# Patient Record
Sex: Female | Born: 1990
Health system: Southern US, Community
[De-identification: ages and names within clinical notes are randomized; demographics above are authoritative.]

## PROBLEM LIST (undated history)

## (undated) DIAGNOSIS — Z8489 Family history of other specified conditions: Secondary | ICD-10-CM

## (undated) DIAGNOSIS — R011 Cardiac murmur, unspecified: Secondary | ICD-10-CM

## (undated) DIAGNOSIS — G43909 Migraine, unspecified, not intractable, without status migrainosus: Secondary | ICD-10-CM

## (undated) DIAGNOSIS — F419 Anxiety disorder, unspecified: Secondary | ICD-10-CM

## (undated) DIAGNOSIS — K219 Gastro-esophageal reflux disease without esophagitis: Secondary | ICD-10-CM

## (undated) DIAGNOSIS — N809 Endometriosis, unspecified: Secondary | ICD-10-CM

## (undated) HISTORY — DX: Endometriosis, unspecified: N80.9

## (undated) HISTORY — PX: WISDOM TOOTH EXTRACTION: SHX21

## (undated) HISTORY — PX: OTHER SURGICAL HISTORY: SHX169

## (undated) HISTORY — PX: LAPAROSCOPY: SHX197

---
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
## 2011-04-05 ENCOUNTER — Emergency Department (HOSPITAL_COMMUNITY)
Admission: EM | Admit: 2011-04-05 | Discharge: 2011-04-06 | Disposition: A | Discharge status: Home / Self Care | Payer: PPO | Attending: Emergency Medicine | Admitting: Emergency Medicine

## 2011-04-05 DIAGNOSIS — E860 Dehydration: Secondary | ICD-10-CM

## 2011-04-05 DIAGNOSIS — B9789 Other viral agents as the cause of diseases classified elsewhere: Secondary | ICD-10-CM | POA: Insufficient documentation

## 2011-04-05 DIAGNOSIS — B349 Viral infection, unspecified: Secondary | ICD-10-CM

## 2011-04-05 DIAGNOSIS — R112 Nausea with vomiting, unspecified: Secondary | ICD-10-CM | POA: Insufficient documentation

## 2011-04-05 DIAGNOSIS — R51 Headache: Secondary | ICD-10-CM | POA: Insufficient documentation

## 2011-04-05 DIAGNOSIS — R197 Diarrhea, unspecified: Secondary | ICD-10-CM | POA: Insufficient documentation

## 2011-04-05 HISTORY — DX: Migraine, unspecified, not intractable, without status migrainosus: G43.909

## 2011-04-05 HISTORY — DX: Cardiac murmur, unspecified: R01.1

## 2011-04-06 ENCOUNTER — Encounter: Payer: Self-pay | Admitting: Emergency Medicine

## 2011-04-06 LAB — COMPREHENSIVE METABOLIC PANEL
AST: 21 U/L (ref 0–37)
Albumin: 4.5 g/dL (ref 3.5–5.2)
BUN: 17 mg/dL (ref 6–23)
Calcium: 9.8 mg/dL (ref 8.4–10.5)
Chloride: 102 mEq/L (ref 96–112)
Creatinine, Ser: 0.66 mg/dL (ref 0.50–1.10)
Total Protein: 7.5 g/dL (ref 6.0–8.3)

## 2011-04-06 LAB — URINALYSIS, ROUTINE W REFLEX MICROSCOPIC
Glucose, UA: NEGATIVE mg/dL
Ketones, ur: 40 mg/dL — AB
Leukocytes, UA: NEGATIVE
Nitrite: NEGATIVE
Specific Gravity, Urine: 1.033 — ABNORMAL HIGH (ref 1.005–1.030)
pH: 8 (ref 5.0–8.0)

## 2011-04-06 LAB — DIFFERENTIAL
Basophils Absolute: 0 10*3/uL (ref 0.0–0.1)
Basophils Relative: 0 % (ref 0–1)
Eosinophils Absolute: 0 10*3/uL (ref 0.0–0.7)
Monocytes Absolute: 0.7 10*3/uL (ref 0.1–1.0)
Monocytes Relative: 5 % (ref 3–12)
Neutro Abs: 13.3 10*3/uL — ABNORMAL HIGH (ref 1.7–7.7)

## 2011-04-06 LAB — CBC
HCT: 37.1 % (ref 36.0–46.0)
MCH: 30.5 pg (ref 26.0–34.0)
MCHC: 35.3 g/dL (ref 30.0–36.0)
RDW: 12.7 % (ref 11.5–15.5)

## 2011-04-06 LAB — PREGNANCY, URINE: Preg Test, Ur: NEGATIVE

## 2011-04-06 MED ORDER — ONDANSETRON HCL 4 MG PO TABS: 4.0000 mg | ORAL_TABLET | Freq: Four times a day (QID) | ORAL | Status: AC

## 2011-04-06 MED ORDER — ONDANSETRON HCL 4 MG/2ML IJ SOLN
4.0000 mg | Freq: Once | INTRAMUSCULAR | Status: AC
  Administered 2011-04-06: 4 mg via INTRAVENOUS
  Filled 2011-04-06: qty 2

## 2011-04-06 MED ORDER — SODIUM CHLORIDE 0.9 % IV BOLUS (SEPSIS)
1000.0000 mL | Freq: Once | INTRAVENOUS | Status: AC
  Administered 2011-04-06: 1000 mL via INTRAVENOUS

## 2011-04-06 NOTE — ED Notes (Signed)
Patient denies pain and is resting comfortably. Pt states she feels a lot better.

## 2011-04-06 NOTE — ED Notes (Signed)
Pt to ER with c/o n/v/diarrhea x 3 days. 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 Pt also reports h/a x 2 days. Pt states symptoms got worse this am

## 2011-04-06 NOTE — ED Notes (Signed)
Pt given discharge instructions and verbalizes understanding  

## 2011-04-06 NOTE — ED Notes (Signed)
Pt states she has not felt well all day feeling tired and wore out  Pt states when she got off work at 1700 she was feeling nauseated  Vomiting started about 1800  Pt states she had a migraine earlier which is still there but has improved

## 2011-04-06 NOTE — ED Provider Notes (Cosign Needed Addendum)
History     CSN: [id number] Arrival date & time: 04/05/2011 11:48 PM   First MD Initiated Contact with Patient 04/06/11 0131      Chief Complaint  Patient presents with  . Nausea  . Migraine  . Emesis    (Consider location/radiation/quality/duration/timing/severity/associated sxs/prior treatment) HPI Patient is a Manufacturing systems engineer. She, states she's been having bad. "Migraines" throughout the day. She has also been having nausea, vomiting, diarrhea, and fatigue. She's had no abdominal or back pain. Denies any dysuria. She has had no numbness, weakness or tingling. She did take ibuprofen, which helped out her headache a lot. She has no chest pain or palpitations. She's had no recent travel   Past Medical History  Diagnosis Date  . Migraines   . Heart murmur     Past Surgical History  Procedure Date  . Wisdom tooth extraction     Family History  Problem Relation Age of Onset  . Heart disease Other   . Diabetes Other     History  Substance Use Topics  . Smoking status: Current Some Day Smoker  . Smokeless tobacco: Not on file  . Alcohol Use: Yes     occassional    OB History    Grav Para Term Preterm Abortions TAB SAB Ect Mult Living                  Review of Systems  All other systems reviewed and are negative.    Allergies  Penicillins  Home Medications   Current Outpatient Rx  Name Route Sig Dispense Refill  . IBUPROFEN 200 MG PO TABS Oral Take 400 mg by mouth every 6 (six) hours as needed. Migraines       BP 111/73  Pulse 98  Temp(Src) 99.4 F (37.4 C) (Oral)  Resp 18  SpO2 99%  LMP 03/20/2011  Physical Exam  Constitutional: She is oriented to person, place, and time. She appears well-developed and well-nourished. No distress.  HENT:  Head: Normocephalic and atraumatic.  Eyes: Conjunctivae and EOM are normal. Pupils are equal, round, and reactive to light.  Neck: Neck supple.  Cardiovascular: Normal rate and regular rhythm.  Exam  reveals no gallop and no friction rub.   No murmur heard. Pulmonary/Chest: Breath sounds normal. She has no wheezes. She has no rales. She exhibits no tenderness.  Abdominal: Soft. Bowel sounds are normal. She exhibits no distension. There is no tenderness. There is no rebound and no guarding.  Musculoskeletal: Normal range of motion.  Neurological: She is alert and oriented to person, place, and time. No cranial nerve deficit. Coordination normal.  Skin: Skin is warm and dry. No rash noted.  Psychiatric: She has a normal mood and affect.    ED Course  Procedures (including critical care time)  Labs Reviewed  CBC - Abnormal; Notable for the following:    WBC 14.3 (*)    All other components within normal limits  DIFFERENTIAL - Abnormal; Notable for the following:    Neutrophils Relative 93 (*)    Neutro Abs 13.3 (*)    Lymphocytes Relative 2 (*)    Lymphs Abs 0.3 (*)    All other components within normal limits  COMPREHENSIVE METABOLIC PANEL - Abnormal; Notable for the following:    Glucose, Bld 102 (*)    Total Bilirubin 1.5 (*)    All other components within normal limits  URINALYSIS, ROUTINE W REFLEX MICROSCOPIC - Abnormal; Notable for the following:    Specific Gravity, Urine  1.033 (*)    Ketones, ur 40 (*)    All other components within normal limits  PREGNANCY, URINE   No results found.   No diagnosis found.    MDM  Pt is seen and examined;  Initial history and physical completed.  Will follow.          Linda Biehn A. Patrica Duel, MD 04/06/11 [phone]  Results for orders placed during the hospital encounter of 04/05/11  CBC      Component Value Range   WBC 14.3 (*) 4.0 - 10.5 (K/uL)   RBC 4.29  3.87 - 5.11 (MIL/uL)   Hemoglobin 13.1  12.0 - 15.0 (g/dL)   HCT 54.0  98.1 - 19.1 (%)   MCV 86.5  78.0 - 100.0 (fL)   MCH 30.5  26.0 - 34.0 (pg)   MCHC 35.3  30.0 - 36.0 (g/dL)   RDW 47.8  29.5 - 62.1 (%)   Platelets 236  150 - 400 (K/uL)  DIFFERENTIAL      Component  Value Range   Neutrophils Relative 93 (*) 43 - 77 (%)   Neutro Abs 13.3 (*) 1.7 - 7.7 (K/uL)   Lymphocytes Relative 2 (*) 12 - 46 (%)   Lymphs Abs 0.3 (*) 0.7 - 4.0 (K/uL)   Monocytes Relative 5  3 - 12 (%)   Monocytes Absolute 0.7  0.1 - 1.0 (K/uL)   Eosinophils Relative 0  0 - 5 (%)   Eosinophils Absolute 0.0  0.0 - 0.7 (K/uL)   Basophils Relative 0  0 - 1 (%)   Basophils Absolute 0.0  0.0 - 0.1 (K/uL)  COMPREHENSIVE METABOLIC PANEL      Component Value Range   Sodium 137  135 - 145 (mEq/L)   Potassium 3.9  3.5 - 5.1 (mEq/L)   Chloride 102  96 - 112 (mEq/L)   CO2 23  19 - 32 (mEq/L)   Glucose, Bld 102 (*) 70 - 99 (mg/dL)   BUN 17  6 - 23 (mg/dL)   Creatinine, Ser 3.08  0.50 - 1.10 (mg/dL)   Calcium 9.8  8.4 - 65.7 (mg/dL)   Total Protein 7.5  6.0 - 8.3 (g/dL)   Albumin 4.5  3.5 - 5.2 (g/dL)   AST 21  0 - 37 (U/L)   ALT 19  0 - 35 (U/L)   Alkaline Phosphatase 71  39 - 117 (U/L)   Total Bilirubin 1.5 (*) 0.3 - 1.2 (mg/dL)   GFR calc non Af Amer >90  >90 (mL/min)   GFR calc Af Amer >90  >90 (mL/min)  PREGNANCY, URINE      Component Value Range   Preg Test, Ur NEGATIVE    URINALYSIS, ROUTINE W REFLEX MICROSCOPIC      Component Value Range   Color, Urine YELLOW  YELLOW    Appearance CLEAR  CLEAR    Specific Gravity, Urine 1.033 (*) 1.005 - 1.030    pH 8.0  5.0 - 8.0    Glucose, UA NEGATIVE  NEGATIVE (mg/dL)   Hgb urine dipstick NEGATIVE  NEGATIVE    Bilirubin Urine NEGATIVE  NEGATIVE    Ketones, ur 40 (*) NEGATIVE (mg/dL)   Protein, ur NEGATIVE  NEGATIVE (mg/dL)   Urobilinogen, UA 0.2  0.0 - 1.0 (mg/dL)   Nitrite NEGATIVE  NEGATIVE    Leukocytes, UA NEGATIVE  NEGATIVE    No results found.  Patient is feeling much better after IV fluids. Ketones noted in urine. Electrolytes normal. Patient is afebrile. Consider  viral syndrome. As the etiology for her symptoms. She would like to go much receiving IV fluids. She was told to take a bland diet, encourage liquids. Tylenol or  Motrin for pain or fever. Return to the ER for any concerns.  Mark Hassey A. Patrica Duel, MD 04/06/11 [phone]

## 2011-05-24 ENCOUNTER — Ambulatory Visit (INDEPENDENT_AMBULATORY_CARE_PROVIDER_SITE_OTHER): Payer: PPO

## 2011-05-24 DIAGNOSIS — Z0000 Encounter for general adult medical examination without abnormal findings: Secondary | ICD-10-CM

## 2011-05-26 ENCOUNTER — Ambulatory Visit (INDEPENDENT_AMBULATORY_CARE_PROVIDER_SITE_OTHER): Payer: PPO

## 2011-05-26 DIAGNOSIS — Z0389 Encounter for observation for other suspected diseases and conditions ruled out: Secondary | ICD-10-CM

## 2011-09-03 ENCOUNTER — Ambulatory Visit (INDEPENDENT_AMBULATORY_CARE_PROVIDER_SITE_OTHER): Payer: PPO | Admitting: Internal Medicine

## 2011-09-03 DIAGNOSIS — R1084 Generalized abdominal pain: Secondary | ICD-10-CM

## 2011-09-03 DIAGNOSIS — R110 Nausea: Secondary | ICD-10-CM

## 2011-09-03 DIAGNOSIS — R300 Dysuria: Secondary | ICD-10-CM

## 2011-09-03 LAB — POCT CBC
HCT, POC: 40.3 % (ref 37.7–47.9)
Hemoglobin: 13.6 g/dL (ref 12.2–16.2)
Lymph, poc: 2.4 (ref 0.6–3.4)
MCH, POC: 30.6 pg (ref 27–31.2)
MCHC: 33.7 g/dL (ref 31.8–35.4)
MPV: 9.7 fL (ref 0–99.8)
POC MID %: 11.6 %M (ref 0–12)
RBC: 4.45 M/uL (ref 4.04–5.48)
WBC: 12.6 10*3/uL — AB (ref 4.6–10.2)

## 2011-09-03 LAB — POCT URINE PREGNANCY: Preg Test, Ur: NEGATIVE

## 2011-09-03 MED ORDER — OMEPRAZOLE 20 MG PO CPDR: 20.0000 mg | DELAYED_RELEASE_CAPSULE | Freq: Every day | ORAL | Status: DC

## 2011-09-03 NOTE — Progress Notes (Signed)
  Subjective:    Patient ID: Theresa Christensen, female    DOB: [birth year], 21 y.o.   MRN: [id number]  HPI Abdominal pain Right upper abdomen pain and mid epigstric pain Onset 2 weeks ago  Moderate in severity Occurs after meals esp lunch and Dinner No fried or fatty foods No milk intolerence Has been on a diet Has lost 10 pounds in past few weeks. No fever No diarhhea No travels No sore thoat    Review of Systems  Constitutional: Positive for activity change.  Eyes: Negative.   Respiratory: Negative.   Cardiovascular: Negative.   Gastrointestinal: Positive for abdominal pain. Negative for nausea, vomiting, diarrhea, constipation and abdominal distention.  Genitourinary: Negative.   Musculoskeletal: Negative.   Neurological: Negative.   Hematological: Negative.   Psychiatric/Behavioral: Negative.   All other systems reviewed and are negative.       Objective:   Physical Exam  Nursing note and vitals reviewed. Constitutional: She is oriented to person, place, and time. She appears well-developed and well-nourished.  HENT:  Head: Normocephalic and atraumatic.  Right Ear: External ear normal.  Left Ear: External ear normal.  Nose: Nose normal.  Mouth/Throat: Oropharynx is clear and moist.  Eyes: Conjunctivae and EOM are normal. Pupils are equal, round, and reactive to light.  Neck: Normal range of motion.  Cardiovascular: Normal rate, regular rhythm, normal heart sounds and intact distal pulses.   Pulmonary/Chest: Effort normal.  Abdominal: Soft. She exhibits no distension and no mass. There is tenderness. There is no rebound and no guarding.       Tenderness in  the ruq and mid epigastric are.  Musculoskeletal: Normal range of motion.  Neurological: She is alert and oriented to person, place, and time. She has normal reflexes.  Skin: Skin is warm and dry.  Psychiatric: She has a normal mood and affect. Her behavior is normal. Judgment and thought content normal.     Results for orders placed in visit on 09/03/11  POCT URINE PREGNANCY      Component Value Range   Preg Test, Ur Negative    GLUCOSE, POCT (MANUAL RESULT ENTRY)      Component Value Range   POC Glucose 85    POCT CBC      Component Value Range   WBC 12.6 (*) 4.6 - 10.2 (K/uL)   Lymph, poc 2.4  0.6 - 3.4    POC LYMPH PERCENT 19.2  10 - 50 (%L)   MID (cbc) 1.5 (*) 0 - 0.9    POC MID % 11.6  0 - 12 (%M)   POC Granulocyte 8.7 (*) 2 - 6.9    Granulocyte percent 69.2  37 - 80 (%G)   RBC 4.45  4.04 - 5.48 (M/uL)   Hemoglobin 13.6  12.2 - 16.2 (g/dL)   HCT, POC 78.2  95.6 - 47.9 (%)   MCV 90.5  80 - 97 (fL)   MCH, POC 30.6  27 - 31.2 (pg)   MCHC 33.7  31.8 - 35.4 (g/dL)   RDW, POC 21.3     Platelet Count, POC 298  142 - 424 (K/uL)   MPV 9.7  0 - 99.8 (fL)   Elevated wbc no fever will obtain US to eval abd including gb and appendix. Not pregnant glucose normal.     Assessment & Plan:  abd pain after losing weight on portion controlled diet denies diet pills or supplements. Will eval gallbladder and appendix and will refer to surgery if postive

## 2011-09-03 NOTE — Patient Instructions (Signed)
prilosec as directed. To have ultrasound to evaluate the gallbladder and the appendix . Low fat non fried diet. fup as directed.

## 2011-09-05 ENCOUNTER — Ambulatory Visit
Admission: RE | Admit: 2011-09-05 | Discharge: 2011-09-05 | Disposition: A | Discharge status: Home / Self Care | Payer: PPO | Source: Ambulatory Visit | Attending: Internal Medicine | Admitting: Internal Medicine

## 2011-09-05 ENCOUNTER — Telehealth: Payer: Self-pay | Admitting: Radiology

## 2011-09-05 NOTE — Telephone Encounter (Signed)
Spoke to mother. The results of the abd. ultrasound were given to her. They were negative and we asked that the patient come back in if the symptoms persisted. The mother said they were concerned about ulcer disease. She feels Dr. Mindi Junker has a plan in place. She does not want to return to the office but wants Dr. Mindi Junker to call her.

## 2015-02-16 ENCOUNTER — Ambulatory Visit (INDEPENDENT_AMBULATORY_CARE_PROVIDER_SITE_OTHER): Payer: 59 | Admitting: Family Medicine

## 2015-02-16 VITALS — BP 110/70 | HR 77 | Temp 98.9°F | Resp 18 | Ht 66.0 in | Wt 168.0 lb

## 2015-02-16 DIAGNOSIS — F411 Generalized anxiety disorder: Secondary | ICD-10-CM

## 2015-02-16 MED ORDER — FLUOXETINE HCL 20 MG PO TABS: 20.0000 mg | ORAL_TABLET | Freq: Every day | ORAL | Status: DC

## 2015-02-16 NOTE — Progress Notes (Signed)
[facility] [address], [place] [address] [id number]  Date:  02/16/2015   Name:  Theresa Christensen   DOB:  [birth year]   MRN:  [id number]  PCP:  No primary care provider on file.    Chief Complaint: Anxiety   History of Present Illness:  Theresa Christensen is a 24 y.o. very pleasant female patient who presents with the following:  Generally healthy young lady who is here today with concern of anxiety.   Over the last 18 months she notes that she tends to be "overthinking things," can be irritable, tends to pick at her face or bite her lip. She is not sure if her sx are worsening lately or if she is more aware of her sx now.   She did have some sx of anxiety as a young child- she might get frightened easily, would have a hard time sleeping.    She notes that sometimes she will "get a feeling like something is wrong and then I will have to call everyone I know to make sure they are ok," sometimes a "bad feeling" will cause her to avoid going out of the house.  She tends to be irritable and over- protective of her friends and family.    She does not have much in the way of OCD symptoms. Spends less than an hour daily on "checking" behavior She does not notice any overly sad mood.  Worry is her main problem.   She can have a very hard time being in crowds.    She works at [employer] as a Holiday representative.  She lives with her GF- they have a good relationship, but she does feel that her anxiety can get in the way of their relationship happiness For fun she enjoys hanging out with friends. Dinner, movies, etc.    She does not smoker, she drinks alcohol rarely.  She does not use any drugs.   She has never been treated for anxiety in the past.  She generally does not see doctors.    Her sleep is sporadic- can be ok, but sometimes she does not sleep well due to worried  She feels like anxiety bothers her about 70% of the time if not more.    She did recently have  labs drawn through her job and all looked good.    There are no active problems to display for this patient.   Past Medical History  Diagnosis Date  . Migraines   . Heart murmur     Past Surgical History  Procedure Laterality Date  . Wisdom tooth extraction      Social History  Substance Use Topics  . Smoking status: Never Smoker   . Smokeless tobacco: None  . Alcohol Use: No     Comment: occassional    Family History  Problem Relation Age of Onset  . Heart disease Other   . Diabetes Other     Allergies  Allergen Reactions  . Penicillins Itching    Rash with minor swelling    Medication list has been reviewed and updated.  Current Outpatient Prescriptions on File Prior to Visit  Medication Sig Dispense Refill  . ibuprofen (ADVIL,MOTRIN) 200 MG tablet Take 400 mg by mouth every 6 (six) hours as needed. Migraines     . omeprazole (PRILOSEC) 20 MG capsule Take 1 capsule (20 mg total) by mouth daily. 30 capsule 3   No current facility-administered medications on file prior to visit.  Review of Systems:  As per HPI- otherwise negative.   Physical Examination: Filed Vitals:   02/16/15 1437  BP: 110/70  Pulse: 77  Temp: 98.9 F (37.2 C)  Resp: 18   Filed Vitals:   02/16/15 1437  Height:  (1.676 m)  Weight: 168 lb (76.204 kg)   Body mass index is 27.13 kg/(m^2). Ideal Body Weight: Weight in (lb) to have BMI = 25: 154.6  GEN: WDWN, NAD, Non-toxic, A & O x 3, overweight, well appearing  HEENT: Atraumatic, Normocephalic. Neck supple. No masses, No LAD. Ears and Nose: No external deformity. CV: RRR, No M/G/R. No JVD. No thrill. No extra heart sounds. PULM: CTA B, no wheezes, crackles, rhonchi. No retractions. No resp. distress. No accessory muscle use. ABD: S, NT, ND EXTR: No c/c/e NEURO Normal gait.  PSYCH: Normally interactive. Conversant. Not depressed or anxious appearing.  Calm demeanor.   Assessment and Plan: GAD (generalized anxiety  disorder) - Plan: FLUoxetine (PROZAC) 20 MG tablet  Start on prozac 20 mg for GAD- will increase to 40 mg after a couple of weeks She will look into counseling but it not sure if she can afford it.   Follow-up in a few months  Signed Abbe Amsterdam, MD

## 2015-02-16 NOTE — Patient Instructions (Signed)
Try the prozac for your anxiety- take 1 daily, increase to 2 pills after 2 weeks.  I hope that after a few weeks you will notice a decrease in your anxiety!   Try to take good care of your body with exercise and healthy diet, plenty of water.   Using melatonin before bed can help with sleep.  If you are able to afford it, counseling can also be helpful- the [employer] Today website has a fairly comprehensive list of providers in town.    Please email or call me in a month to let me know how your are doing- sooner if any concerns Otherwise let's check back in 2-3 months

## 2015-04-25 ENCOUNTER — Telehealth: Payer: Self-pay

## 2015-04-25 NOTE — Telephone Encounter (Addendum)
[facility] - Pt was to touch base with you after 1-2 months.  Her insurance is requesting medicine to be mailed.  Says any medicine that is prescribed on a regular basis will be done this way.  I think she is also going to need pre-authorization for it.  She is not sure about how the process works.  Please call her at [phone]

## 2015-04-26 NOTE — Telephone Encounter (Signed)
Called patient back. She stated that the Prozac 20mg  has helped her a lot within the last few months. She has noticed a positive difference as well as people around her. She wants to continue to take the medication.   She also stated that her insurance company will no longer allow her to get her medications filled at the [facility] [place], [place]. They told her that she will need to use a mail order pharmacy. When asked what pharmacy did she wanted to use, she stated that the insurance company would call us to let us know. As of today, I do not see a new pharmacy listed for her nor a call from the insurance company.  I also explained to her how a pre-auth works and reassured her that we could help her in anyway possible.

## 2015-04-27 ENCOUNTER — Other Ambulatory Visit: Payer: Self-pay

## 2015-04-27 DIAGNOSIS — F411 Generalized anxiety disorder: Secondary | ICD-10-CM

## 2015-04-27 MED ORDER — FLUOXETINE HCL 20 MG PO TABS: 20.0000 mg | ORAL_TABLET | Freq: Every day | ORAL | Status: DC

## 2015-05-30 ENCOUNTER — Other Ambulatory Visit: Payer: Self-pay

## 2015-05-30 DIAGNOSIS — F411 Generalized anxiety disorder: Secondary | ICD-10-CM

## 2015-05-30 MED ORDER — FLUOXETINE HCL 20 MG PO TABS: 20.0000 mg | ORAL_TABLET | Freq: Every day | ORAL | Status: DC

## 2017-02-15 ENCOUNTER — Encounter: Payer: Self-pay | Admitting: Family Medicine

## 2017-02-15 ENCOUNTER — Ambulatory Visit (INDEPENDENT_AMBULATORY_CARE_PROVIDER_SITE_OTHER): Payer: 59 | Admitting: Family Medicine

## 2017-02-15 VITALS — BP 102/68 | HR 70 | Temp 98.2°F | Resp 18 | Ht 65.0 in | Wt 183.0 lb

## 2017-02-15 DIAGNOSIS — E663 Overweight: Secondary | ICD-10-CM | POA: Diagnosis not present

## 2017-02-15 DIAGNOSIS — Z6830 Body mass index (BMI) 30.0-30.9, adult: Secondary | ICD-10-CM

## 2017-02-15 NOTE — Progress Notes (Signed)
9/29/20183:40 PM  Theresa Christensen 11/06/1990, 26 y.o. female [id number]  Chief Complaint  Patient presents with  . Weight Loss    Has an appeal form from insurance    HPI:   Patient is a 26 y.o. female who presents today requesting that a form to appeal her insurance be completed. Her employer charges $50 more if certain criteria are not meet. In patient's case it is her BMI > 30. She however has been working on her weight loss. She used to weigh 206 lbs. She had been exercising daily with cardio and weights. She had to stop 7 weeks ago due to retinal surgery for detachment. Optho just released her back to regular exercise. She has started with cardio. She continues to try to eat healthier, but continues to struggles with this specially with working through cravings and when eating out with her parents.   Depression screen [facility] 2/9 02/15/2017 02/16/2015  Decreased Interest 0 0  Down, Depressed, Hopeless 0 0  PHQ - 2 Score 0 0    Allergies  Allergen Reactions  . Penicillins Itching    Rash with minor swelling    Current Outpatient Prescriptions on File Prior to Visit  Medication Sig Dispense Refill  . ibuprofen (ADVIL,MOTRIN) 200 MG tablet Take 400 mg by mouth every 6 (six) hours as needed. Migraines      No current facility-administered medications on file prior to visit.     Past Medical History:  Diagnosis Date  . Heart murmur   . Migraines     Past Surgical History:  Procedure Laterality Date  . WISDOM TOOTH EXTRACTION      Social History  Substance Use Topics  . Smoking status: Never Smoker  . Smokeless tobacco: Never Used  . Alcohol use No     Comment: occassional    Family History  Problem Relation Age of Onset  . Heart disease Other   . Diabetes Other     Review of Systems  Constitutional: Negative for chills and fever.  Respiratory: Negative for cough and shortness of breath.   Cardiovascular: Negative for chest pain, palpitations and leg  swelling.  Gastrointestinal: Negative for abdominal pain, nausea and vomiting.     OBJECTIVE:  Blood pressure 102/68, pulse 70, temperature 98.2 F (36.8 C), temperature source Oral, resp. rate 18, height  (1.651 m), weight 183 lb (83 kg), SpO2 99 %.  Physical Exam  Constitutional: She is oriented to person, place, and time and well-developed, well-nourished, and in no distress.  HENT:  Head: Normocephalic and atraumatic.  Mouth/Throat: Oropharynx is clear and moist. No oropharyngeal exudate.  Eyes: Pupils are equal, round, and reactive to light. EOM are normal. No scleral icterus.  Neck: Neck supple.  Cardiovascular: Normal rate, regular rhythm and normal heart sounds.  Exam reveals no gallop and no friction rub.   No murmur heard. Pulmonary/Chest: Effort normal and breath sounds normal. She has no wheezes. She has no rales.  Musculoskeletal: She exhibits no edema.  Neurological: She is alert and oriented to person, place, and time. Gait normal.  Skin: Skin is warm and dry.    ASSESSMENT and PLAN  1. Body mass index (BMI) of 30.0 to 30.9 in adult Greater than 50% of this 25 minutes visit was spent providing counseling on weight loss, exercise and diet. Brained stormed strategies, used MI techniques, congratulated on current gains and encouraged continue lifestyle changes. Paperwork for [employer] completed.    Return in about 1 year (around  02/15/2018).    Myles Lipps, MD Primary Care at [facility] [address] [place], [place] [address] Ph.  [phone] Fax [phone]

## 2017-02-15 NOTE — Patient Instructions (Signed)
     IF you received an x-ray today, you will receive an invoice from [facility]. Please contact [facility] at [phone] with questions or concerns regarding your invoice.   IF you received labwork today, you will receive an invoice from [facility]. Please contact [facility] at [phone] with questions or concerns regarding your invoice.   Our billing staff will not be able to assist you with questions regarding bills from these companies.  You will be contacted with the lab results as soon as they are available. The fastest way to get your results is to activate your My Chart account. Instructions are located on the last page of this paperwork. If you have not heard from us regarding the results in 2 weeks, please contact this office.     

## 2017-10-16 DIAGNOSIS — H43393 Other vitreous opacities, bilateral: Secondary | ICD-10-CM | POA: Diagnosis not present

## 2017-10-16 DIAGNOSIS — H35413 Lattice degeneration of retina, bilateral: Secondary | ICD-10-CM | POA: Diagnosis not present

## 2017-11-15 ENCOUNTER — Encounter: Payer: Self-pay | Admitting: Family Medicine

## 2017-11-15 ENCOUNTER — Ambulatory Visit: Payer: BLUE CROSS/BLUE SHIELD | Admitting: Family Medicine

## 2017-11-15 VITALS — BP 124/81 | HR 63 | Temp 98.8°F | Resp 16 | Ht 65.0 in | Wt 174.0 lb

## 2017-11-15 DIAGNOSIS — G4700 Insomnia, unspecified: Secondary | ICD-10-CM

## 2017-11-15 DIAGNOSIS — F411 Generalized anxiety disorder: Secondary | ICD-10-CM

## 2017-11-15 MED ORDER — HYDROXYZINE HCL 10 MG PO TABS: 10.0000 mg | ORAL_TABLET | Freq: Three times a day (TID) | ORAL | 0 refills | Status: DC | PRN

## 2017-11-15 MED ORDER — CITALOPRAM HYDROBROMIDE 20 MG PO TABS: 20.0000 mg | ORAL_TABLET | Freq: Every day | ORAL | 1 refills | Status: DC

## 2017-11-15 NOTE — Patient Instructions (Addendum)
See info on anxiety below. I would recommend starting back on medication as well as meeting with a therapist as we discussed. Here are a few numbers, but let me know if other names needed. You can also see if there is an EAP program at work that may have more cost effective options for counseling. For sleep - can try prescription hydroxyzine if needed (that can be used during anxiety flares as well if needed). See other info below.   Start celexa once per day and recheck in next few weeks. Restarting exercise most day per week.   Karmen Bongo: [phone] [place]: [phone]  Insomnia Insomnia is a sleep disorder that makes it difficult to fall asleep or to stay asleep. Insomnia can cause tiredness (fatigue), low energy, difficulty concentrating, mood swings, and poor performance at work or school. There are three different ways to classify insomnia:  Difficulty falling asleep.  Difficulty staying asleep.  Waking up too early in the morning.  Any type of insomnia can be long-term (chronic) or short-term (acute). Both are common. Short-term insomnia usually lasts for three months or less. Chronic insomnia occurs at least three times a week for longer than three months. What are the causes? Insomnia may be caused by another condition, situation, or substance, such as:  Anxiety.  Certain medicines.  Gastroesophageal reflux disease (GERD) or other gastrointestinal conditions.  Asthma or other breathing conditions.  Restless legs syndrome, sleep apnea, or other sleep disorders.  Chronic pain.  Menopause. This may include hot flashes.  Stroke.  Abuse of alcohol, tobacco, or illegal drugs.  Depression.  Caffeine.  Neurological disorders, such as Alzheimer disease.  An overactive thyroid (hyperthyroidism).  The cause of insomnia may not be known. What increases the risk? Risk factors for insomnia include:  Gender. Women are more commonly affected than men.  Age.  Insomnia is more common as you get older.  Stress. This may involve your professional or personal life.  Income. Insomnia is more common in people with lower income.  Lack of exercise.  Irregular work schedule or night shifts.  Traveling between different time zones.  What are the signs or symptoms? If you have insomnia, trouble falling asleep or trouble staying asleep is the main symptom. This may lead to other symptoms, such as:  Feeling fatigued.  Feeling nervous about going to sleep.  Not feeling rested in the morning.  Having trouble concentrating.  Feeling irritable, anxious, or depressed.  How is this treated? Treatment for insomnia depends on the cause. If your insomnia is caused by an underlying condition, treatment will focus on addressing the condition. Treatment may also include:  Medicines to help you sleep.  Counseling or therapy.  Lifestyle adjustments.  Follow these instructions at home:  Take medicines only as directed by your health care provider.  Keep regular sleeping and waking hours. Avoid naps.  Keep a sleep diary to help you and your health care provider figure out what could be causing your insomnia. Include: ? When you sleep. ? When you wake up during the night. ? How well you sleep. ? How rested you feel the next day. ? Any side effects of medicines you are taking. ? What you eat and drink.  Make your bedroom a comfortable place where it is easy to fall asleep: ? Put up shades or special blackout curtains to block light from outside. ? Use a white noise machine to block noise. ? Keep the temperature cool.  Exercise regularly as directed by  your health care provider. Avoid exercising right before bedtime.  Use relaxation techniques to manage stress. Ask your health care provider to suggest some techniques that may work well for you. These may include: ? Breathing exercises. ? Routines to release muscle tension. ? Visualizing peaceful  scenes.  Cut back on alcohol, caffeinated beverages, and cigarettes, especially close to bedtime. These can disrupt your sleep.  Do not overeat or eat spicy foods right before bedtime. This can lead to digestive discomfort that can make it hard for you to sleep.  Limit screen use before bedtime. This includes: ? Watching TV. ? Using your smartphone, tablet, and computer.  Stick to a routine. This can help you fall asleep faster. Try to do a quiet activity, brush your teeth, and go to bed at the same time each night.  Get out of bed if you are still awake after 15 minutes of trying to sleep. Keep the lights down, but try reading or doing a quiet activity. When you feel sleepy, go back to bed.  Make sure that you drive carefully. Avoid driving if you feel very sleepy.  Keep all follow-up appointments as directed by your health care provider. This is important. Contact a health care provider if:  You are tired throughout the day or have trouble in your daily routine due to sleepiness.  You continue to have sleep problems or your sleep problems get worse. Get help right away if:  You have serious thoughts about hurting yourself or someone else. This information is not intended to replace advice given to you by your health care provider. Make sure you discuss any questions you have with your health care provider. Document Released: 05/03/2000 Document Revised: 10/06/2015 Document Reviewed: 02/04/2014 Elsevier Interactive Patient Education  2018 [employer]   Generalized Anxiety Disorder, Adult Generalized anxiety disorder (GAD) is a mental health disorder. People with this condition constantly worry about everyday events. Unlike normal anxiety, worry related to GAD is not triggered by a specific event. These worries also do not fade or get better with time. GAD interferes with life functions, including relationships, work, and school. GAD can vary from mild to severe. People with severe  GAD can have intense waves of anxiety with physical symptoms (panic attacks). What are the causes? The exact cause of GAD is not known. What increases the risk? This condition is more likely to develop in:  Women.  People who have a family history of anxiety disorders.  People who are very shy.  People who experience very stressful life events, such as the death of a loved one.  People who have a very stressful family environment.  What are the signs or symptoms? People with GAD often worry excessively about many things in their lives, such as their health and family. They may also be overly concerned about:  Doing well at work.  Being on time.  Natural disasters.  Friendships.  Physical symptoms of GAD include:  Fatigue.  Muscle tension or having muscle twitches.  Trembling or feeling shaky.  Being easily startled.  Feeling like your heart is pounding or racing.  Feeling out of breath or like you cannot take a deep breath.  Having trouble falling asleep or staying asleep.  Sweating.  Nausea, diarrhea, or irritable bowel syndrome (IBS).  Headaches.  Trouble concentrating or remembering facts.  Restlessness.  Irritability.  How is this diagnosed? Your health care provider can diagnose GAD based on your symptoms and medical history. You will also have a  physical exam. The health care provider will ask specific questions about your symptoms, including how severe they are, when they started, and if they come and go. Your health care provider may ask you about your use of alcohol or drugs, including prescription medicines. Your health care provider may refer you to a mental health specialist for further evaluation. Your health care provider will do a thorough examination and may perform additional tests to rule out other possible causes of your symptoms. To be diagnosed with GAD, a person must have anxiety that:  Is out of his or her control.  Affects several  different aspects of his or her life, such as work and relationships.  Causes distress that makes him or her unable to take part in normal activities.  Includes at least three physical symptoms of GAD, such as restlessness, fatigue, trouble concentrating, irritability, muscle tension, or sleep problems.  Before your health care provider can confirm a diagnosis of GAD, these symptoms must be present more days than they are not, and they must last for six months or longer. How is this treated? The following therapies are usually used to treat GAD:  Medicine. Antidepressant medicine is usually prescribed for long-term daily control. Antianxiety medicines may be added in severe cases, especially when panic attacks occur.  Talk therapy (psychotherapy). Certain types of talk therapy can be helpful in treating GAD by providing support, education, and guidance. Options include: ? Cognitive behavioral therapy (CBT). People learn coping skills and techniques to ease their anxiety. They learn to identify unrealistic or negative thoughts and behaviors and to replace them with positive ones. ? Acceptance and commitment therapy (ACT). This treatment teaches people how to be mindful as a way to cope with unwanted thoughts and feelings. ? Biofeedback. This process trains you to manage your body's response (physiological response) through breathing techniques and relaxation methods. You will work with a therapist while machines are used to monitor your physical symptoms.  Stress management techniques. These include yoga, meditation, and exercise.  A mental health specialist can help determine which treatment is best for you. Some people see improvement with one type of therapy. However, other people require a combination of therapies. Follow these instructions at home:  Take over-the-counter and prescription medicines only as told by your health care provider.  Try to maintain a normal routine.  Try to  anticipate stressful situations and allow extra time to manage them.  Practice any stress management or self-calming techniques as taught by your health care provider.  Do not punish yourself for setbacks or for not making progress.  Try to recognize your accomplishments, even if they are small.  Keep all follow-up visits as told by your health care provider. This is important. Contact a health care provider if:  Your symptoms do not get better.  Your symptoms get worse.  You have signs of depression, such as: ? A persistently sad, cranky, or irritable mood. ? Loss of enjoyment in activities that used to bring you joy. ? Change in weight or eating. ? Changes in sleeping habits. ? Avoiding friends or family members. ? Loss of energy for normal tasks. ? Feelings of guilt or worthlessness. Get help right away if:  You have serious thoughts about hurting yourself or others. If you ever feel like you may hurt yourself or others, or have thoughts about taking your own life, get help right away. You can go to your nearest emergency department or call:  Your local emergency services (911 in  the [place]  A suicide crisis helpline, such as the National Suicide Prevention Lifeline at (810)705-33701-(450)555-3640. This is open 24 hours a day.  Summary  Generalized anxiety disorder (GAD) is a mental health disorder that involves worry that is not triggered by a specific event.  People with GAD often worry excessively about many things in their lives, such as their health and family.  GAD may cause physical symptoms such as restlessness, trouble concentrating, sleep problems, frequent sweating, nausea, diarrhea, headaches, and trembling or muscle twitching.  A mental health specialist can help determine which treatment is best for you. Some people see improvement with one type of therapy. However, other people require a combination of therapies. This information is not intended to replace advice given to  you by your health care provider. Make sure you discuss any questions you have with your health care provider. Document Released: 08/31/2012 Document Revised: 03/26/2016 Document Reviewed: 03/26/2016 Elsevier Interactive Patient Education  2018 [employer]    IF you received an x-ray today, you will [facility] an [facility]. Please contact [facility] at [phone] with questions or concerns regarding your invoice.   IF you received labwork today, you will receive an invoice from [facility]. Please contact [facility] at [phone] with questions or concerns regarding your invoice.   Our billing staff will not be able to assist you with questions regarding bills from these companies.  You will be contacted with the lab results as soon as they are available. The fastest way to get your results is to activate your My Chart account. Instructions are located on the last page of this paperwork. If you have not heard from us regarding the results in 2 weeks, please contact this office.

## 2017-11-15 NOTE — Progress Notes (Signed)
Subjective:  By signing my name below, I, Diona Browner, attest that this documentation has been prepared under the direction and in the presence of Shade Flood, MD. Electronically Signed: Diona Browner, Medical Scribe 11/15/17 at 10:55 AM.   Patient ID: Theresa Christensen, female    DOB: 1990-06-14, 27 y.o.   MRN: [id number]  Chief Complaint  Patient presents with  . Anxiety   HPI Theresa Christensen is a 27 y.o. female who presents to Primary Care at [facility] complaining of anxiety, she is a new patient to me.   1) Anxiety, Seen in 2016, by Dr. Patsy Lager. Anxiety was discussed at that time. She was started on Prozac 20mg  with plans to go to 40mg  dose. Counseling discussed at that time. The patient took Prozac but felt it didn't do anything for her, adding she isn't depressed. She took 2 pills for about 2 months and never saw a Veterinary surgeon.   She feels that her anxiety is worsening and is starting to affect her relationships. She has become nervous to leave her house, worried about possible bad things that could happen to her. Adding she will stop breathing at times, not realizing it. She doesn't remember a day where she wasn't anxious, in a long time. She works at [employer] and was promoted to a higher position. Her anxiety is not different between work or home. The patient normally is active and exercises a few times a week. She also tries to eat healthy. Occasionally, she is unable to sleep due to a restless mind, otherwise she denies any trouble sleeping. The patient reports her main reason for not following up a with a counselor is the cost of the copay. She has never had her thyroid tested. Her mother has anxiety as well. The patient has been told she is starting to act like her mother. She is unable to take melatonin because she experiences night terrors. The patient denies SI, HI, or any other symptoms or complaints at this time.    Greater than 50% counseling.  Depression screen [facility] 2/9  11/15/2017 02/15/2017 02/16/2015  Decreased Interest 0 0 0  Down, Depressed, Hopeless 0 0 0  PHQ - 2 Score 0 0 0  Altered sleeping 1 - -  Tired, decreased energy 0 - -  Change in appetite 1 - -  Feeling bad or failure about yourself  0 - -  Trouble concentrating 0 - -  Moving slowly or fidgety/restless 0 - -  Suicidal thoughts 0 - -  PHQ-9 Score 2 - -  Difficult doing work/chores Not difficult at all - -    Patient Active Problem List   Diagnosis Date Noted  . GAD (generalized anxiety disorder) 02/16/2015   Past Medical History:  Diagnosis Date  . Heart murmur   . Migraines    Past Surgical History:  Procedure Laterality Date  . WISDOM TOOTH EXTRACTION     Allergies  Allergen Reactions  . Penicillins Itching    Rash with minor swelling   Prior to Admission medications   Medication Sig Start Date End Date Taking? Authorizing Provider  ibuprofen (ADVIL,MOTRIN) 200 MG tablet Take 400 mg by mouth every 6 (six) hours as needed. Migraines    Yes [provider]   Social History   Socioeconomic History  . Marital status: Single    Spouse name: Not on file  . Number of children: Not on file  . Years of education: Not on file  . Highest education level: Not on  file  Occupational History  . Not on file  Social Needs  . Financial resource strain: Not on file  . Food insecurity:    Worry: Not on file    Inability: Not on file  . Transportation needs:    Medical: Not on file    Non-medical: Not on file  Tobacco Use  . Smoking status: Never Smoker  . Smokeless tobacco: Never Used  Substance and Sexual Activity  . Alcohol use: No    Alcohol/week: 0.0 oz    Comment: occassional  . Drug use: No    Types: Marijuana  . Sexual activity: Not on file  Lifestyle  . Physical activity:    Days per week: Not on file    Minutes per session: Not on file  . Stress: Not on file  Relationships  . Social connections:    Talks on phone: Not on file    Gets together: Not  on file    Attends religious service: Not on file    Active member of club or organization: Not on file    Attends meetings of clubs or organizations: Not on file    Relationship status: Not on file  . Intimate partner violence:    Fear of current or ex partner: Not on file    Emotionally abused: Not on file    Physically abused: Not on file    Forced sexual activity: Not on file  Other Topics Concern  . Not on file  Social History Narrative  . Not on file    Review of Systems  Constitutional: Positive for activity change.  Psychiatric/Behavioral: Positive for sleep disturbance. Negative for self-injury and suicidal ideas. The patient is nervous/anxious.   All other systems reviewed and are negative.      Objective:   Physical Exam  Constitutional: She is oriented to person, place, and time. She appears well-developed and well-nourished.  HENT:  Head: Normocephalic and atraumatic.  Eyes: Pupils are equal, round, and reactive to light. Conjunctivae and EOM are normal.  Neck: Carotid bruit is not present.  Cardiovascular: Normal rate, regular rhythm, normal heart sounds and intact distal pulses.  Pulmonary/Chest: Effort normal and breath sounds normal.  Abdominal: Soft. She exhibits no pulsatile midline mass. There is no tenderness.  Neurological: She is alert and oriented to person, place, and time.  Skin: Skin is warm and dry.  Psychiatric: She has a normal mood and affect. Her behavior is normal.  Vitals reviewed.   Vitals:   11/15/17 1035  BP: 124/81  Pulse: 63  Resp: 16  Temp: 98.8 F (37.1 C)  TempSrc: Oral  SpO2: 99%  Weight: 174 lb (78.9 kg)  Height: 5\' 5"  (1.651 m)      Assessment & Plan:    Theresa Christensen is a 27 y.o. female Generalized anxiety disorder - Plan: hydrOXYzine (ATARAX/VISTARIL) 10 MG tablet, citalopram (CELEXA) 20 MG tablet, TSH  Insomnia, unspecified type Suspected longstanding generalized anxiety with recent worsening. Various  treatments discussed, but suspect she will benefit form SSRI and CBT/therapy.   -check TSH.,   -start celexa - potential side effects discussed.   - phone numbers for therapy given, but may be more cost effective to use work EAP program if available.   - exercise and other stress mgt techniques discussed.   - recheck next few weeks.   Meds ordered this encounter  Medications  . hydrOXYzine (ATARAX/VISTARIL) 10 MG tablet    Sig: Take 1 tablet (10 mg total) by mouth  3 (three) times daily as needed.    Dispense:  30 tablet    Refill:  0  . citalopram (CELEXA) 20 MG tablet    Sig: Take 1 tablet (20 mg total) by mouth daily.    Dispense:  30 tablet    Refill:  1   Patient Instructions   See info on anxiety below. I would recommend starting back on medication as well as meeting with a therapist as we discussed. Here are a few numbers, but let me know if other names needed. You can also see if there is an EAP program at work that may have more cost effective options for counseling. For sleep - can try prescription hydroxyzine if needed (that can be used during anxiety flares as well if needed). See other info below.   Start celexa once per day and recheck in next few weeks. Restarting exercise most day per week.   Karmen BongoAaron Stewart: [phone] [place] PedClaire Huprich:: [phone]  Insomnia Insomnia is a sleep disorder that makes it difficult to fall asleep or to stay asleep. Insomnia can cause tiredness (fatigue), low energy, difficulty concentrating, mood swings, and poor performance at work or school. There are three different ways to classify insomnia:  Difficulty falling asleep.  Difficulty staying asleep.  Waking up too early in the morning.  Any type of insomnia can be long-term (chronic) or short-term (acute). Both are common. Short-term insomnia usually lasts for three months or less. Chronic insomnia occurs at least three times a week for longer than three months. What are the causes? Insomnia  may be caused by another condition, situation, or substance, such as:  Anxiety.  Certain medicines.  Gastroesophageal reflux disease (GERD) or other gastrointestinal conditions.  Asthma or other breathing conditions.  Restless legs syndrome, sleep apnea, or other sleep disorders.  Chronic pain.  Menopause. This may include hot flashes.  Stroke.  Abuse of alcohol, tobacco, or illegal drugs.  Depression.  Caffeine.  Neurological disorders, such as Alzheimer disease.  An overactive thyroid (hyperthyroidism).  The cause of insomnia may not be known. What increases the risk? Risk factors for insomnia include:  Gender. Women are more commonly affected than men.  Age. Insomnia is more common as you get older.  Stress. This may involve your professional or personal life.  Income. Insomnia is more common in people with lower income.  Lack of exercise.  Irregular work schedule or night shifts.  Traveling between different time zones.  What are the signs or symptoms? If you have insomnia, trouble falling asleep or trouble staying asleep is the main symptom. This may lead to other symptoms, such as:  Feeling fatigued.  Feeling nervous about going to sleep.  Not feeling rested in the morning.  Having trouble concentrating.  Feeling irritable, anxious, or depressed.  How is this treated? Treatment for insomnia depends on the cause. If your insomnia is caused by an underlying condition, treatment will focus on addressing the condition. Treatment may also include:  Medicines to help you sleep.  Counseling or therapy.  Lifestyle adjustments.  Follow these instructions at home:  Take medicines only as directed by your health care provider.  Keep regular sleeping and waking hours. Avoid naps.  Keep a sleep diary to help you and your health care provider figure out what could be causing your insomnia. Include: ? When you sleep. ? When you wake up during the  night. ? How well you sleep. ? How rested you feel the next day. ? Any side  effects of medicines you are taking. ? What you eat and drink.  Make your bedroom a comfortable place where it is easy to fall asleep: ? Put up shades or special blackout curtains to block light from outside. ? Use a white noise machine to block noise. ? Keep the temperature cool.  Exercise regularly as directed by your health care provider. Avoid exercising right before bedtime.  Use relaxation techniques to manage stress. Ask your health care provider to suggest some techniques that may work well for you. These may include: ? Breathing exercises. ? Routines to release muscle tension. ? Visualizing peaceful scenes.  Cut back on alcohol, caffeinated beverages, and cigarettes, especially close to bedtime. These can disrupt your sleep.  Do not overeat or eat spicy foods right before bedtime. This can lead to digestive discomfort that can make it hard for you to sleep.  Limit screen use before bedtime. This includes: ? Watching TV. ? Using your smartphone, tablet, and computer.  Stick to a routine. This can help you fall asleep faster. Try to do a quiet activity, brush your teeth, and go to bed at the same time each night.  Get out of bed if you are still awake after 15 minutes of trying to sleep. Keep the lights down, but try reading or doing a quiet activity. When you feel sleepy, go back to bed.  Make sure that you drive carefully. Avoid driving if you feel very sleepy.  Keep all follow-up appointments as directed by your health care provider. This is important. Contact a health care provider if:  You are tired throughout the day or have trouble in your daily routine due to sleepiness.  You continue to have sleep problems or your sleep problems get worse. Get help right away if:  You have serious thoughts about hurting yourself or someone else. This information is not intended to replace advice given  to you by your health care provider. Make sure you discuss any questions you have with your health care provider. Document Released: 05/03/2000 Document Revised: 10/06/2015 Document Reviewed: 02/04/2014 Elsevier Interactive Patient Education  2018 [employer] Inc[employer]   Generalized Anxiety Disorder, Adult Generalized anxiety disorder (GAD) is a mental health disorder. People with this condition constantly worry about everyday events. Unlike normal anxiety, worry related to GAD is not triggered by a specific event. These worries also do not fade or get better with time. GAD interferes with life functions, including relationships, work, and school. GAD can vary from mild to severe. People with severe GAD can have intense waves of anxiety with physical symptoms (panic attacks). What are the causes? The exact cause of GAD is not known. What increases the risk? This condition is more likely to develop in:  Women.  People who have a family history of anxiety disorders.  People who are very shy.  People who experience very stressful life events, such as the death of a loved one.  People who have a very stressful family environment.  What are the signs or symptoms? People with GAD often worry excessively about many things in their lives, such as their health and family. They may also be overly concerned about:  Doing well at work.  Being on time.  Natural disasters.  Friendships.  Physical symptoms of GAD include:  Fatigue.  Muscle tension or having muscle twitches.  Trembling or feeling shaky.  Being easily startled.  Feeling like your heart is pounding or racing.  Feeling out of breath or like you cannot  take a deep breath.  Having trouble falling asleep or staying asleep.  Sweating.  Nausea, diarrhea, or irritable bowel syndrome (IBS).  Headaches.  Trouble concentrating or remembering facts.  Restlessness.  Irritability.  How is this diagnosed? Your health care  provider can diagnose GAD based on your symptoms and medical history. You will also have a physical exam. The health care provider will ask specific questions about your symptoms, including how severe they are, when they started, and if they come and go. Your health care provider may ask you about your use of alcohol or drugs, including prescription medicines. Your health care provider may refer you to a mental health specialist for further evaluation. Your health care provider will do a thorough examination and may perform additional tests to rule out other possible causes of your symptoms. To be diagnosed with GAD, a person must have anxiety that:  Is out of his or her control.  Affects several different aspects of his or her life, such as work and relationships.  Causes distress that makes him or her unable to take part in normal activities.  Includes at least three physical symptoms of GAD, such as restlessness, fatigue, trouble concentrating, irritability, muscle tension, or sleep problems.  Before your health care provider can confirm a diagnosis of GAD, these symptoms must be present more days than they are not, and they must last for six months or longer. How is this treated? The following therapies are usually used to treat GAD:  Medicine. Antidepressant medicine is usually prescribed for long-term daily control. Antianxiety medicines may be added in severe cases, especially when panic attacks occur.  Talk therapy (psychotherapy). Certain types of talk therapy can be helpful in treating GAD by providing support, education, and guidance. Options include: ? Cognitive behavioral therapy (CBT). People learn coping skills and techniques to ease their anxiety. They learn to identify unrealistic or negative thoughts and behaviors and to replace them with positive ones. ? Acceptance and commitment therapy (ACT). This treatment teaches people how to be mindful as a way to cope with unwanted  thoughts and feelings. ? Biofeedback. This process trains you to manage your body's response (physiological response) through breathing techniques and relaxation methods. You will work with a therapist while machines are used to monitor your physical symptoms.  Stress management techniques. These include yoga, meditation, and exercise.  A mental health specialist can help determine which treatment is best for you. Some people see improvement with one type of therapy. However, other people require a combination of therapies. Follow these instructions at home:  Take over-the-counter and prescription medicines only as told by your health care provider.  Try to maintain a normal routine.  Try to anticipate stressful situations and allow extra time to manage them.  Practice any stress management or self-calming techniques as taught by your health care provider.  Do not punish yourself for setbacks or for not making progress.  Try to recognize your accomplishments, even if they are small.  Keep all follow-up visits as told by your health care provider. This is important. Contact a health care provider if:  Your symptoms do not get better.  Your symptoms get worse.  You have signs of depression, such as: ? A persistently sad, cranky, or irritable mood. ? Loss of enjoyment in activities that used to bring you joy. ? Change in weight or eating. ? Changes in sleeping habits. ? Avoiding friends or family members. ? Loss of energy for normal tasks. ? Feelings of  guilt or worthlessness. Get help right away if:  You have serious thoughts about hurting yourself or others. If you ever feel like you may hurt yourself or others, or have thoughts about taking your own life, get help right away. You can go to your nearest emergency department or call:  Your local emergency services (911 in the [place]  A suicide crisis helpline, such as the National Suicide Prevention Lifeline at 857 228 9908.  This is open 24 hours a day.  Summary  Generalized anxiety disorder (GAD) is a mental health disorder that involves worry that is not triggered by a specific event.  People with GAD often worry excessively about many things in their lives, such as their health and family.  GAD may cause physical symptoms such as restlessness, trouble concentrating, sleep problems, frequent sweating, nausea, diarrhea, headaches, and trembling or muscle twitching.  A mental health specialist can help determine which treatment is best for you. Some people see improvement with one type of therapy. However, other people require a combination of therapies. This information is not intended to replace advice given to you by your health care provider. Make sure you discuss any questions you have with your health care provider. Document Released: 08/31/2012 Document Revised: 03/26/2016 Document Reviewed: 03/26/2016 Elsevier Interactive Patient Education  2018 [employer].    IF you received an x-ray today, you will receive an invoice from [facility]. Please contact [facility] at [phone] with questions or concerns regarding your invoice.   IF you received labwork today, you will receive an invoice from [place]. Please contact [facility] at [phone] with questions or concerns regarding your invoice.   Our billing staff will not be able to assist you with questions regarding bills from these companies.  You will be contacted with the lab results as soon as they are available. The fastest way to get your results is to activate your My Chart account. Instructions are located on the last page of this paperwork. If you have not heard from [place] regarding the results in 2 weeks, please contact this office.       I personally performed the services described in this documentation, which was scribed in my presence. The recorded information has been reviewed and considered for accuracy and  completeness, addended by me as needed, and agree with information above.  Signed,   Meredith Staggers, MD Primary Care at [facility].  11/19/17 2:45 PM

## 2017-11-16 LAB — TSH: TSH: 2.63 u[IU]/mL (ref 0.450–4.500)

## 2017-12-30 LAB — BASIC METABOLIC PANEL: Glucose: 71

## 2017-12-30 LAB — LIPID PANEL
CHOLESTEROL: 135 (ref 0–200)
HDL: 44 (ref 35–70)
LDL Cholesterol: 82
TRIGLYCERIDES: 44 (ref 40–160)

## 2017-12-30 LAB — HEMOGLOBIN A1C: HEMOGLOBIN A1C: 4.7

## 2018-02-21 ENCOUNTER — Encounter: Payer: Self-pay | Admitting: Family Medicine

## 2018-02-21 ENCOUNTER — Encounter: Payer: Self-pay | Admitting: *Deleted

## 2018-02-21 ENCOUNTER — Ambulatory Visit: Payer: BLUE CROSS/BLUE SHIELD | Admitting: Family Medicine

## 2018-02-21 ENCOUNTER — Other Ambulatory Visit: Payer: Self-pay

## 2018-02-21 VITALS — BP 108/65 | HR 69 | Temp 98.1°F | Resp 16 | Ht 66.06 in | Wt 164.4 lb

## 2018-02-21 DIAGNOSIS — Z6826 Body mass index (BMI) 26.0-26.9, adult: Secondary | ICD-10-CM | POA: Diagnosis not present

## 2018-02-21 NOTE — Patient Instructions (Addendum)
   If you have lab work done today you will be contacted with your lab results within the next 2 weeks.  If you have not heard from us then please contact us. The fastest way to get your results is to register for My Chart.   IF you received an x-ray today, you will receive an invoice from [facility]. Please contact [facility] at [phone] with questions or concerns regarding your invoice.   IF you received labwork today, you will receive an invoice from [facility]. Please contact [facility] at [phone] with questions or concerns regarding your invoice.   Our billing staff will not be able to assist you with questions regarding bills from these companies.  You will be contacted with the lab results as soon as they are available. The fastest way to get your results is to activate your My Chart account. Instructions are located on the last page of this paperwork. If you have not heard from us regarding the results in 2 weeks, please contact this office.      Preventing Unhealthy Weight Gain, Adult Staying at a healthy weight is important. When fat builds up in your body, you may become overweight or obese. These conditions put you at greater risk for developing certain health problems, such as heart disease, diabetes, sleeping problems, joint problems, and some cancers. Unhealthy weight gain is often the result of making unhealthy choices in what you eat. It is also a result of not getting enough exercise. You can make changes to your lifestyle to prevent obesity and stay as healthy as possible. What nutrition changes can be made? To maintain a healthy weight and prevent obesity:  Eat only as much as your body needs. To do this: ? Pay attention to signs that you are hungry or full. Stop eating as soon as you feel full. ? If you feel hungry, try drinking water first. Drink enough water so your urine is clear or pale yellow. ? Eat smaller portions. ? Look at serving  sizes on food labels. Most foods contain more than one serving per container. ? Eat the recommended amount of calories for your gender and activity level. While most active people should eat around 2,000 calories per day, if you are trying to lose weight or are not very active, you main need to eat less calories. Talk to your health care provider or dietitian about how many calories you should eat each day.  Choose healthy foods, such as: ? Fruits and vegetables. Try to fill at least half of your plate at each meal with fruits and vegetables. ? Whole grains, such as whole wheat bread, brown rice, and quinoa. ? Lean meats, such as chicken or fish. ? Other healthy proteins, such as beans, eggs, or tofu. ? Healthy fats, such as nuts, seeds, fatty fish, and olive oil. ? Low-fat or fat-free dairy.  Check food labels and avoid food and drinks that: ? Are high in calories. ? Have added sugar. ? Are high in sodium. ? Have saturated fats or trans fats.  Limit how much you eat of the following foods: ? Prepackaged meals. ? Fast food. ? Fried foods. ? Processed meat, such as bacon, sausage, and deli meats. ? Fatty cuts of red meat and poultry with skin.  Cook foods in healthier ways, such as by baking, broiling, or grilling.  When grocery shopping, try to shop around the outside of the store. This helps you buy mostly fresh foods and avoid canned and   prepackaged foods.  What lifestyle changes can be made?  Exercise at least 30 minutes 5 or more days each week. Exercising includes brisk walking, yard work, biking, running, swimming, and team sports like basketball and soccer. Ask your health care provider which exercises are safe for you.  Do not use any products that contain nicotine or tobacco, such as cigarettes and e-cigarettes. If you need help quitting, ask your health care provider.  Limit alcohol intake to no more than 1 drink a day for nonpregnant women and 2 drinks a day for men. One  drink equals 12 oz of beer, 5 oz of wine, or 1 oz of hard liquor.  Try to get 7-9 hours of sleep each night. What other changes can be made?  Keep a food and activity journal to keep track of: ? What you ate and how many calories you had. Remember to count sauces, dressings, and side dishes. ? Whether you were active, and what exercises you did. ? Your calorie, weight, and activity goals.  Check your weight regularly. Track any changes. If you notice you have gained weight, make changes to your diet or activity routine.  Avoid taking weight-loss medicines or supplements. Talk to your health care provider before starting any new medicine or supplement.  Talk to your health care provider before trying any new diet or exercise plan. Why are these changes important? Eating healthy, staying active, and having healthy habits not only help prevent obesity, they also:  Help you to manage stress and emotions.  Help you to connect with friends and family.  Improve your self-esteem.  Improve your sleep.  Prevent long-term health problems.  What can happen if changes are not made? Being obese or overweight can cause you to develop joint or bone problems, which can make it hard for you to stay active or do activities you enjoy. Being obese or overweight also puts stress on your heart and lungs and can lead to health problems like diabetes, heart disease, and some cancers. Where to find more information: Talk with your health care provider or a dietitian about healthy eating and healthy lifestyle choices. You may also find other information through these resources:  U.S. Department of Agriculture MyPlate: www.choosemyplate.gov  American Heart Association: www.heart.org  Centers for Disease Control and Prevention: www.cdc.gov  Summary  Staying at a healthy weight is important. It helps prevent certain diseases and health problems, such as heart disease, diabetes, joint problems, sleep  disorders, and some cancers.  Being obese or overweight can cause you to develop joint or bone problems, which can make it hard for you to stay active or do activities you enjoy.  You can prevent unhealthy weight gain by eating a healthy diet, exercising regularly, not smoking, limiting alcohol, and getting enough sleep.  Talk with your health care provider or a dietitian for guidance about healthy eating and healthy lifestyle choices. This information is not intended to replace advice given to you by your health care provider. Make sure you discuss any questions you have with your health care provider. Document Released: 05/07/2016 Document Revised: 06/12/2016 Document Reviewed: 06/12/2016 Elsevier Interactive Patient Education  2018 Elsevier Inc.  

## 2018-02-21 NOTE — Progress Notes (Signed)
Patient ID: Theresa Christensen, female    DOB: 1990-11-16, 27 y.o.   MRN: [id number]  PCP: Patient, No Pcp Per  Chief Complaint  Patient presents with  . Body Mass Index    pt need form filled out for work   . Weight Loss    Subjective:  HPI Theresa Christensen is a 27 y.o. female presents for completion of insurance exemption form. Patients She has a long history of obesity. She has achieved weight loss since last year by improving diet. Reports consistent with routine exercise.  Body mass index is 26.48 kg/m.No previous referrals to weight management.  At present has no existing comorbidities.  Recent hemoglobin A1c 4.70. Denies any other complaint. Social History   Socioeconomic History  . Marital status: Single    Spouse name: Not on file  . Number of children: Not on file  . Years of education: Not on file  . Highest education level: Not on file  Occupational History  . Not on file  Social Needs  . Financial resource strain: Not on file  . Food insecurity:    Worry: Not on file    Inability: Not on file  . Transportation needs:    Medical: Not on file    Non-medical: Not on file  Tobacco Use  . Smoking status: Never Smoker  . Smokeless tobacco: Never Used  Substance and Sexual Activity  . Alcohol use: No    Alcohol/week: 0.0 standard drinks    Comment: occassional  . Drug use: No    Types: Marijuana  . Sexual activity: Not on file  Lifestyle  . Physical activity:    Days per week: Not on file    Minutes per session: Not on file  . Stress: Not on file  Relationships  . Social connections:    Talks on phone: Not on file    Gets together: Not on file    Attends religious service: Not on file    Active member of club or organization: Not on file    Attends meetings of clubs or organizations: Not on file    Relationship status: Not on file  . Intimate partner violence:    Fear of current or ex partner: Not on file    Emotionally abused: Not on file    Physically  abused: Not on file    Forced sexual activity: Not on file  Other Topics Concern  . Not on file  Social History Narrative  . Not on file    Family History  Problem Relation Age of Onset  . Heart disease Other   . Diabetes Other    Review of Systems  Pertinent negatives listed in HPI  Patient Active Problem List   Diagnosis Date Noted  . GAD (generalized anxiety disorder) 02/16/2015    Allergies  Allergen Reactions  . Penicillins Itching    Rash with minor swelling    Prior to Admission medications   Medication Sig Start Date End Date Taking? Authorizing Provider  citalopram (CELEXA) 20 MG tablet Take 1 tablet (20 mg total) by mouth daily. 11/15/17   Shade Flood, MD  hydrOXYzine (ATARAX/VISTARIL) 10 MG tablet Take 1 tablet (10 mg total) by mouth 3 (three) times daily as needed. 11/15/17   Shade Flood, MD  ibuprofen (ADVIL,MOTRIN) 200 MG tablet Take 400 mg by mouth every 6 (six) hours as needed. Migraines     [provider]    Past Medical, Surgical Family and Social History reviewed and  updated.    Objective:   Today's Vitals   02/21/18 0807  BP: 108/65  Pulse: 69  Resp: 16  Temp: 98.1 F (36.7 C)  TempSrc: Oral  SpO2: 98%  Weight: 164 lb 6.4 oz (74.6 kg)  Height: 5' 6.06" (1.678 m)  Body mass index is 26.48 kg/m.   Wt Readings from Last 3 Encounters:  11/15/17 174 lb (78.9 kg)  02/15/17 183 lb (83 kg)  02/16/15 168 lb (76.2 kg)     Physical Exam General appearance: alert, well developed, well nourished, cooperative and in no distress Head: Normocephalic, without obvious abnormality, atraumatic Respiratory: Respirations even and unlabored, normal respiratory rate Heart: rate and rhythm normal. No gallop or murmurs noted on exam  Extremities: No gross deformities Skin: Skin color, texture, turgor normal. No rashes seen  Psych: Appropriate mood and affect. Neurologic: Mental status: Alert, oriented to person, place, and time,  thought content appropriate.  Lab Results  Component Value Date   POCGLU 85 09/03/2011    No results found for: HGBA1C 12/2017 resulted at [facility]-4.70   Assessment & Plan:  1. Body mass index (bmi) 26.0-26.9, adult Form completed. A1C and cholesterol normal.  She has achieved great weight management over the last year. Continue to improve diet and exercise.   Godfrey Pick. Tiburcio Pea, FNP-C Nurse Practitioner (PRN Staff)  Primary Care at [facility] [address] [place], [place]  [phone]

## 2018-04-21 DIAGNOSIS — F411 Generalized anxiety disorder: Secondary | ICD-10-CM | POA: Diagnosis not present

## 2019-10-14 ENCOUNTER — Telehealth: Payer: Self-pay | Admitting: *Deleted

## 2019-10-14 LAB — OB RESULTS CONSOLE HIV ANTIBODY (ROUTINE TESTING): HIV: NONREACTIVE

## 2019-10-14 LAB — OB RESULTS CONSOLE RUBELLA ANTIBODY, IGM: Rubella: IMMUNE

## 2019-10-14 LAB — OB RESULTS CONSOLE HEPATITIS B SURFACE ANTIGEN: Hepatitis B Surface Ag: NEGATIVE

## 2019-10-14 LAB — OB RESULTS CONSOLE RPR: RPR: NONREACTIVE

## 2019-10-14 NOTE — Telephone Encounter (Signed)
Called and spoke with the patient regarding a referral. Patient scheduled for 6/3 with Dr Andrey Farmer.

## 2019-10-15 DIAGNOSIS — N83209 Unspecified ovarian cyst, unspecified side: Secondary | ICD-10-CM

## 2019-10-21 ENCOUNTER — Other Ambulatory Visit: Payer: Self-pay

## 2019-10-21 ENCOUNTER — Encounter: Payer: Self-pay | Admitting: Gynecologic Oncology

## 2019-10-21 ENCOUNTER — Inpatient Hospital Stay: Payer: Managed Care, Other (non HMO) | Attending: Gynecologic Oncology | Admitting: Gynecologic Oncology

## 2019-10-21 VITALS — BP 122/67 | HR 87 | Temp 98.6°F | Resp 18 | Ht 65.0 in | Wt 184.6 lb

## 2019-10-21 DIAGNOSIS — N83201 Unspecified ovarian cyst, right side: Secondary | ICD-10-CM

## 2019-10-21 DIAGNOSIS — N83209 Unspecified ovarian cyst, unspecified side: Secondary | ICD-10-CM

## 2019-10-21 DIAGNOSIS — Z3401 Encounter for supervision of normal first pregnancy, first trimester: Secondary | ICD-10-CM

## 2019-10-21 DIAGNOSIS — Z3491 Encounter for supervision of normal pregnancy, unspecified, first trimester: Secondary | ICD-10-CM

## 2019-10-21 DIAGNOSIS — Z3A09 9 weeks gestation of pregnancy: Secondary | ICD-10-CM | POA: Diagnosis not present

## 2019-10-21 DIAGNOSIS — O3481 Maternal care for other abnormalities of pelvic organs, first trimester: Secondary | ICD-10-CM | POA: Insufficient documentation

## 2019-10-21 MED ORDER — OXYCODONE HCL 5 MG PO TABS: 5.0000 mg | ORAL_TABLET | ORAL | 0 refills | Status: DC | PRN

## 2019-10-21 NOTE — Progress Notes (Signed)
Consult Note: Gyn-Onc  Consult was requested by Dr. Henderson Cloud for the evaluation of Theresa Christensen 29 y.o. female  CC:  Chief Complaint  Patient presents with  . Cyst of ovary, unspecified laterality    New Patient    Assessment/Plan:  [name]  is a 29 y.o.  year old G1P0 at 43 weeks and 4 days gestational age ([facility] 05/22/20) who has an enlarging 9.5cm simple right ovarian cyst.  The mass is asymptomatic however due to its enlarging size and concern for the development of acute pain in pregnancy and the potential for need of an emergent surgery during pregnancy, I am recommending elective surgical excision during the safe this point in her pregnancy, the early second trimester.  I explained to the patient that surgery in pregnancy is not associated with an increased risk for congenital birth defects, and has not been defined to be associated with an increased risk of preterm delivery when surgeries performed for an elective condition such as this a (for example not ruptured appendix or alternative serious underlying medical condition).  I explained that the anesthesia use in pregnancy has not been associated with birth defects.  I explained however that pregnancy is associated with some increased risk perioperatively including gastroesophageal reflux causing aspiration, DVT and PE risk, and increased risk of bleeding due to increased vascularity of pelvic organs.  Additionally there is a potential risk for damage to the gravid uterus particularly with sharp instrumentation or electrosurgery.  I explained that we will be overly cautious to avoid port placement in close proximity to the uterus and during the procedure with manipulation of pelvic organs.  I explained that we will assess fetal heart tones pre and postoperatively.  An alternative to proceeding with surgery would be expectant management with planned repeat ultrasound postpartum and surgical excision at that time if the cyst  remained.  The patient is electing to proceed with surgery with robotic assisted unilateral ovarian cystectomy versus oophorectomy (if a safe cystectomy cannot be accomplished).  Surgery will be scheduled for the first week of her second trimester when she is free from the highest risk for spontaneous loss.  This would be time for the first couple of weeks of July.  She will obtain a preoperative ultrasound scan with Dr. Henderson Cloud to confirm that there continues to be a cyst present prior to surgery.   HPI: [name] is a 29 year old G1, P0 who was seen in consultation at the request of Dr. Huntley Dec for evaluation of a right adnexal mass in pregnancy.  The patient's estimated due date is May 22, 2020.  This is a desired and planned pregnancy.  She is dated by first trimester ultrasound scan.  She is undergone 3 trimester ultrasound scans the first of which was in September 15, 2019.  This revealed a 3.1 x 1.9 centimeter simple cyst in the right ovary without blood flow.  It was considered to be possibly a corpus luteum cyst.  A repeat ultrasound scan was performed on Sep 27, 2019 at which time an intrauterine pregnancy was seen with a yolk sac but no fetal pole at that time and in the right ovary is 7.8 x 4.7 cm simple appearing cyst was seen without blood flow seen.  Was considered to be either right ovarian or possibly paraovarian.  No left adnexal masses were seen.  A follow-up ultrasound was again performed in the last week of May 2021 and confirmed that the cyst was increasing again this time  to 9.7 cm.  The patient is an otherwise fairly healthy woman.  She has had surgeries including bilateral retinal detachment surgery and a laparoscopy for endometriosis ablation.  She is never been hospitalized.  She has no known bleeding diatheses or history of VTE.  Her family history is significant for a father who died from lung cancer, and a maternal great aunt with breast cancer.  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  Current Meds:  Outpatient Encounter Medications as of 10/21/2019  Medication Sig  . [DISCONTINUED] SRONYX 0.1-20 MG-MCG tablet Take 1 tablet by mouth daily.   No facility-administered encounter medications on file as of 10/21/2019.    Allergy:  Allergies  Allergen Reactions  . Penicillins Itching    Rash with minor swelling    Social Hx:   Social History   Socioeconomic History  . Marital status: Single    Spouse name: Not on file  . Number of children: Not on file  . Years of education: Not on file  . Highest education level: Not on file  Occupational History  . Not on file  Tobacco Use  . Smoking status: Never Smoker  . Smokeless tobacco: Never Used  Substance and Sexual Activity  . Alcohol use: No    Alcohol/week: 0.0 standard drinks    Comment: occassional  . Drug use: No    Types: Marijuana  . Sexual activity: Not on file  Other Topics Concern  . Not on file  Social History Narrative  . Not on file   Social Determinants of Health   Financial Resource Strain:   . Difficulty of Paying Living Expenses:   Food Insecurity:   . Worried About Charity fundraiser in the Last Year:   . Arboriculturist in the Last Year:   Transportation Needs:   . Film/video editor (Medical):   Marland Kitchen Lack of Transportation (Non-Medical):   Physical Activity:   . Days of Exercise per Week:   . Minutes of Exercise per Session:   Stress:   . Feeling of Stress :   Social Connections:   . Frequency of Communication with Friends and Family:   . Frequency of Social Gatherings with Friends and Family:   . Attends Religious Services:   . Active Member of Clubs or Organizations:   . Attends Archivist Meetings:   Marland Kitchen Marital Status:   Intimate Partner Violence:   . Fear of Current or Ex-Partner:   . Emotionally Abused:   Marland Kitchen Physically Abused:   . Sexually Abused:     Past Surgical Hx:  Past Surgical History:  Procedure Laterality Date   . LAPAROSCOPY    . Procedure on Retina    . WISDOM TOOTH EXTRACTION      Past Medical Hx:  Past Medical History:  Diagnosis Date  . Heart murmur   . Migraines     Past Gynecological History:  See HPI No LMP recorded. LMP April, 2021; [facility] 05/22/20.  Family Hx:  Family History  Problem Relation Age of Onset  . Heart disease Other   . Cancer Father        lung  . Heart disease Maternal Grandmother   . Diabetes Maternal Grandfather   . Heart disease Maternal Grandfather   . Cancer Maternal Aunt        breast    Review of Systems:  Constitutional  Feels well,    ENT Normal appearing ears and nares bilaterally Skin/Breast  No rash, sores, jaundice, itching, dryness Cardiovascular  No chest pain, shortness of breath, or edema  Pulmonary  No cough or wheeze.  Gastro Intestinal  No nausea, vomitting, or diarrhoea. No bright red blood per rectum, no abdominal pain, change in bowel movement, or constipation.  Genito Urinary  No frequency, urgency, dysuria,  Musculo Skeletal  No myalgia, arthralgia, joint swelling or pain  Neurologic  No weakness, numbness, change in gait,  Psychology  No depression, anxiety, insomnia.   Vitals:  Blood pressure 122/67, pulse 87, temperature 98.6 F (37 C), temperature source Tympanic, resp. rate 18, height 5\' 5"  (1.651 m), weight 184 lb 9.6 oz (83.7 kg), SpO2 100 %.  Physical Exam: WD in NAD Neck  Supple NROM, without any enlargements.  Lymph Node Survey No cervical supraclavicular or inguinal adenopathy Cardiovascular  Pulse normal rate, regularity and rhythm. S1 and S2 normal.  Lungs  Clear to auscultation bilateraly, without wheezes/crackles/rhonchi. Good air movement.  Skin  No rash/lesions/breakdown  Psychiatry  Alert and oriented to person, place, and time  Abdomen  Normoactive bowel sounds, abdomen soft, non-tender and mildly overweight without evidence of hernia.  Back No CVA tenderness Genito Urinary  Vulva/vagina:  Normal external female genitalia.  No lesions. No discharge or bleeding.  Bladder/urethra:  No lesions or masses, well supported bladder  Vagina: smooth, no palpable masses  Cervix: 2cm, closed, firm  Uterus:  Small, mobile, there is some nodularity posterior to the lower uterine segment  Adnexa: unable to discretely palpate masses. Rectal  Some posterior uterine nodularity noted.  Extremities  No bilateral cyanosis, clubbing or edema.    10/21/2019, 12:04 PM

## 2019-10-21 NOTE — Patient Instructions (Signed)
Preparing for your Surgery  Plan for surgery on November 25, 2019 with Dr. Adolphus Birchwood at [facility]. You will be scheduled for a robotic assisted laparoscopic unilateral ovarian cystectomy, possible oophorectomy.   Pre-operative Testing -You will receive a phone call from presurgical testing at [facility] to arrange for a pre-operative appointment over the phone, lab appointment, and COVID test. The COVID test normally happens 3 days prior to the surgery and they ask that you self quarantine after the test up until surgery to decrease chance of exposure.  -Bring your insurance card, copy of an advanced directive if applicable, medication list  -At that visit, you will be asked to sign a consent for a possible blood transfusion in case a transfusion becomes necessary during surgery.  The need for a blood transfusion is rare but having consent is a necessary part of your care.     -You should not be taking blood thinners or aspirin at least ten days prior to surgery unless instructed by your surgeon (should not be taking due to pregnancy as well).  -Do not take supplements such as fish oil (omega 3), red yeast rice, turmeric before your surgery.   Day Before Surgery at Home -You will be asked to take in a light diet the day before surgery. You will be advised you can have clear liquids after midnight and up until 3 hours before your surgery.    Eat a light diet the day before surgery.  Examples including soups, broths, toast, yogurt, mashed potatoes.  AVOID GAS PRODUCING FOODS. Things to avoid include carbonated beverages (fizzy beverages), raw fruits and raw vegetables, or beans.   If your bowels are filled with gas, your surgeon will have difficulty visualizing your pelvic organs which increases your surgical risks.  Your role in recovery Your role is to become active as soon as directed by your doctor, while still giving yourself time to heal.  Rest when you feel tired. You  will be asked to do the following in order to speed your recovery:  - Cough and breathe deeply. This helps to clear and expand your lungs and can prevent pneumonia after surgery.  - STAY ACTIVE WHEN YOU GET HOME. Do mild physical activity. Walking or moving your legs help your circulation and body functions return to normal. Do not try to get up or walk alone the first time after surgery.   -If you develop swelling on one leg or the other, pain in the back of your leg, redness/warmth in one of your legs, please call the office or go to the Emergency Room to have a doppler to rule out a blood clot. For shortness of breath, chest pain-seek care in the Emergency Room as soon as possible. - Actively manage your pain. Managing your pain lets you move in comfort. We will ask you to rate your pain on a scale of zero to 10. It is your responsibility to tell your doctor or nurse where and how much you hurt so your pain can be treated.  Special Considerations -If you are diabetic, you may be placed on insulin after surgery to have closer control over your blood sugars to promote healing and recovery.  This does not mean that you will be discharged on insulin.  If applicable, your oral antidiabetics will be resumed when you are tolerating a solid diet.  -Your final pathology results from surgery should be available around one week after surgery and the results will be relayed  to you when available.  -Dr. Lahoma Crocker is the surgeon that assists your GYN Oncologist with surgery.  If you end up staying the night, the next day after your surgery you will either see Dr. Denman George, Dr. Berline Lopes, or Dr. Lahoma Crocker.  -FMLA forms can be faxed to [phone] and please allow 5-7 business days for completion.  Pain Management After Surgery -You have been prescribed your pain medication and bowel regimen medications before surgery so that you can have these available when you are discharged from the hospital.  The pain medication is for use ONLY AFTER surgery and a new prescription will not be given.   -Make sure that you have Tylenol at home to use on a regular basis after surgery for pain control.   -Review the attached handout on narcotic use and their risks and side effects.   Bowel Regimen -You can be prescribed Sennakot-S to take nightly to prevent constipation especially if you are taking the narcotic pain medication intermittently.  It is important to prevent constipation and drink adequate amounts of liquids. You can stop taking this medication when you are not taking pain medication and you are back on your normal bowel routine.  Risks of Surgery Risks of surgery are low but include bleeding, infection, damage to surrounding structures, re-operation, blood clots, and very rarely death.   Blood Transfusion Information (For the consent to be signed before surgery)  We will be checking your blood type before surgery so in case of emergencies, we will know what type of blood you would need.                                            WHAT IS A BLOOD TRANSFUSION?  A transfusion is the replacement of blood or some of its parts. Blood is made up of multiple cells which provide different functions.  Red blood cells carry oxygen and are used for blood loss replacement.  White blood cells fight against infection.  Platelets control bleeding.  Plasma helps clot blood.  Other blood products are available for specialized needs, such as hemophilia or other clotting disorders. BEFORE THE TRANSFUSION  Who gives blood for transfusions?   You may be able to donate blood to be used at a later date on yourself (autologous donation).  Relatives can be asked to donate blood. This is generally not any safer than if you have received blood from a stranger. The same precautions are taken to ensure safety when a relative's blood is donated.  Healthy volunteers who are fully evaluated to make sure their  blood is safe. This is blood bank blood. Transfusion therapy is the safest it has ever been in the practice of medicine. Before blood is taken from a donor, a complete history is taken to make sure that person has no history of diseases nor engages in risky social behavior (examples are intravenous drug use or sexual activity with multiple partners). The donor's travel history is screened to minimize risk of transmitting infections, such as malaria. The donated blood is tested for signs of infectious diseases, such as HIV and hepatitis. The blood is then tested to be sure it is compatible with you in order to minimize the chance of a transfusion reaction. If you or a relative donates blood, this is often done in anticipation of surgery and is not appropriate for emergency situations. It  takes many days to process the donated blood. RISKS AND COMPLICATIONS Although transfusion therapy is very safe and saves many lives, the main dangers of transfusion include:   Getting an infectious disease.  Developing a transfusion reaction. This is an allergic reaction to something in the blood you were given. Every precaution is taken to prevent this. The decision to have a blood transfusion has been considered carefully by your caregiver before blood is given. Blood is not given unless the benefits outweigh the risks.  AFTER SURGERY INSTRUCTIONS  Return to work: 4-6 weeks if applicable  Activity: 1. Be up and out of the bed during the day.  Take a nap if needed.  You may walk up steps but be careful and use the hand rail.  Stair climbing will tire you more than you think, you may need to stop part way and rest.   2. No lifting or straining for 6 weeks over 10 pounds. No pushing, pulling, straining for 6 weeks.  3. No driving for 1 week(s).  Do not drive if you are taking narcotic pain medicine and make sure that your reaction time has returned.   4. You can shower as soon as the next day after surgery.  Shower daily.  Use soap and water on your incision and pat dry; don't rub.  No tub baths or submerging your body in water until cleared by your surgeon. If you have the soap that was given to you by pre-surgical testing that was used before surgery, you do not need to use it afterwards because this can irritate your incisions.   5. No sexual activity and nothing in the vagina for 2 weeks.  6. You may experience a small amount of clear drainage from your incisions, which is normal.  If the drainage persists, increases, or changes color please call the office.  7. Do not use creams, lotions, or ointments such as neosporin on your incisions after surgery until advised by your surgeon because they can cause removal of the dermabond glue on your incisions.    9. Take Tylenol first for pain and only use narcotic pain medication for severe pain not relieved by the Tylenol.  Monitor your Tylenol intake to a max of 4,000 mg in a 24 hour period.   Diet: 1. Low sodium Heart Healthy Diet is recommended.  2. It is safe to use a laxative Colace, if you have difficulty moving your bowels. You have been prescribed Sennakot at bedtime every evening to keep bowel movements regular and to prevent constipation.    Wound Care: 1. Keep clean and dry.  Shower daily.  Reasons to call the Doctor:  Fever - Oral temperature greater than 100.4 degrees Fahrenheit  Foul-smelling vaginal discharge  Difficulty urinating  Nausea and vomiting  Increased pain at the site of the incision that is unrelieved with pain medicine.  Difficulty breathing with or without chest pain  New calf pain especially if only on one side  Sudden, continuing increased vaginal bleeding with or without clots.   Contacts: For questions or concerns you should contact:  Dr. Adolphus Birchwood at [phone]  Warner Mccreedy, NP at [phone]  After Hours: call [phone] and have the GYN Oncologist paged/contacted

## 2019-10-26 ENCOUNTER — Telehealth: Payer: Self-pay | Admitting: Oncology

## 2019-10-26 NOTE — Telephone Encounter (Signed)
Martha Clan, RN with Dr. Huel Coventry office at [phone] and advised that Dr. Henderson Cloud will need to put in an order to assess fetal heart tones pre and post op for 11/25/19 surgery.

## 2019-11-02 LAB — OB RESULTS CONSOLE GC/CHLAMYDIA
Chlamydia: NEGATIVE
Gonorrhea: NEGATIVE

## 2019-11-11 NOTE — Patient Instructions (Addendum)
DUE TO COVID-19 ONLY ONE VISITOR IS ALLOWED TO COME WITH YOU AND STAY IN THE WAITING ROOM ONLY DURING PRE OP AND PROCEDURE DAY OF SURGERY. TWO VISITOR MAY VISIT WITH YOU AFTER SURGERY IN YOUR PRIVATE ROOM DURING VISITING HOURS ONLY!  10a-8-p  YOU NEED TO HAVE A COVID 19 TEST ON__7-6-21_____ @_3 :00______, THIS TEST MUST BE DONE BEFORE SURGERY, COME  [address],  [place] , .  [facility]) ONCE YOUR COVID TEST IS COMPLETED, PLEASE BEGIN THE QUARANTINE INSTRUCTIONS AS OUTLINED IN YOUR HANDOUT.                Theresa Christensen  11/11/2019   Your procedure is scheduled on: 11-25-19   Report to [facility] Main  Entrance   Report to short stay  at       0530  AM     Call this number if you have problems the morning of surgery [phone]    Remember: Eat a light diet the day before surgery.  Examples including soups, broths, toast, yogurt, mashed potatoes.  Things to avoid include carbonated beverages (fizzy beverages), raw fruits and raw vegetables, or beans. After midnight swith to clear liquids until 0430 am then nothing by mouth    CLEAR LIQUID DIET   Foods Allowed                                                                     Foods Excluded  Coffee and tea, regular and decaf                                   liquids that you cannot  Plain Jell-O any favor except red or purple                                           see through such as: Fruit ices (not with fruit pulp)                                             milk, soups, orange juice  Iced Popsicles                                            All solid food                                  Cranberry, grape and apple juices Sports drinks like Gatorade Lightly seasoned clear broth or consume(fat free) Sugar, honey syrup  _____________________________________________________________________   If your bowels are filled with gas, your surgeon will have difficulty visualizing your pelvic  organs which increases your surgical risks.  BRUSH YOUR TEETH MORNING OF SURGERY AND RINSE YOUR MOUTH OUT, NO CHEWING GUM CANDY OR MINTS.     Take these medicines the  morning of surgery with A SIP OF WATER: NONE                                 You may not have any metal on your body including hair pins and              piercings  Do not wear jewelry, make-up, lotions, powders or perfumes, deodorant             Do not wear nail polish on your fingernails.  Do not shave  48 hours prior to surgery.     Do not bring valuables to the hospital. [facility] IS NOT             RESPONSIBLE   FOR VALUABLES.  Contacts, dentures or bridgework may not be worn into surgery.      Patients discharged the day of surgery will not be allowed to drive home. IF YOU ARE HAVING SURGERY AND GOING HOME THE SAME DAY, YOU MUST HAVE AN ADULT TO DRIVE YOU HOME AND BE WITH YOU FOR 24 HOURS. YOU MAY GO HOME BY TAXI OR UBER OR ORTHERWISE, BUT AN ADULT MUST ACCOMPANY YOU HOME AND STAY WITH YOU FOR 24 HOURS.  Name and phone number of your driver:  Special Instructions: N/A              Please read over the following fact sheets you were given: _____________________________________________________________________             [facility] - Preparing for Surgery Before surgery, you can play an important role.  Because skin is not sterile, your skin needs to be as free of germs as possible.  You can reduce the number of germs on your skin by washing with CHG (chlorahexidine gluconate) soap before surgery.  CHG is an antiseptic cleaner which kills germs and bonds with the skin to continue killing germs even after washing. Please DO NOT use if you have an allergy to CHG or antibacterial soaps.  If your skin becomes reddened/irritated stop using the CHG and inform your nurse when you arrive at Short Stay. Do not shave (including legs and underarms) for at least 48 hours prior to the first CHG shower.  You may shave your  face/neck. Please follow these instructions carefully:  1.  Shower with CHG Soap the night before surgery and the  morning of Surgery.  2.  If you choose to wash your hair, wash your hair first as usual with your  normal  shampoo.  3.  After you shampoo, rinse your hair and body thoroughly to remove the  shampoo.                           4.  Use CHG as you would any other liquid soap.  You can apply chg directly  to the skin and wash                       Gently with a scrungie or clean washcloth.  5.  Apply the CHG Soap to your body ONLY FROM THE NECK DOWN.   Do not use on face/ open                           Wound or open sores. Avoid contact with eyes, ears mouth and genitals (  private parts).                       Wash face,  Genitals (private parts) with your normal soap.             6.  Wash thoroughly, paying special attention to the area where your surgery  will be performed.  7.  Thoroughly rinse your body with warm water from the neck down.  8.  DO NOT shower/wash with your normal soap after using and rinsing off  the CHG Soap.                9.  Pat yourself dry with a clean towel.            10.  Wear clean pajamas.            11.  Place clean sheets on your bed the night of your first shower and do not  sleep with pets. Day of Surgery : Do not apply any lotions/deodorants the morning of surgery.  Please wear clean clothes to the hospital/surgery center.  FAILURE TO FOLLOW THESE INSTRUCTIONS MAY RESULT IN THE CANCELLATION OF YOUR SURGERY PATIENT SIGNATURE_________________________________  NURSE SIGNATURE__________________________________  ________________________________________________________________________   Theresa Christensen  An incentive spirometer is a tool that can help keep your lungs clear and active. This tool measures how well you are filling your lungs with each breath. Taking long deep breaths may help reverse or decrease the chance of developing breathing  (pulmonary) problems (especially infection) following:  A long period of time when you are unable to move or be active. BEFORE THE PROCEDURE   If the spirometer includes an indicator to show your best effort, your nurse or respiratory therapist will set it to a desired goal.  If possible, sit up straight or lean slightly forward. Try not to slouch.  Hold the incentive spirometer in an upright position. INSTRUCTIONS FOR USE  1. Sit on the edge of your bed if possible, or sit up as far as you can in bed or on a chair. 2. Hold the incentive spirometer in an upright position. 3. Breathe out normally. 4. Place the mouthpiece in your mouth and seal your lips tightly around it. 5. Breathe in slowly and as deeply as possible, raising the piston or the ball toward the top of the column. 6. Hold your breath for 3-5 seconds or for as long as possible. Allow the piston or ball to fall to the bottom of the column. 7. Remove the mouthpiece from your mouth and breathe out normally. 8. Rest for a few seconds and repeat Steps 1 through 7 at least 10 times every 1-2 hours when you are awake. Take your time and take a few normal breaths between deep breaths. 9. The spirometer may include an indicator to show your best effort. Use the indicator as a goal to work toward during each repetition. 10. After each set of 10 deep breaths, practice coughing to be sure your lungs are clear. If you have an incision (the cut made at the time of surgery), support your incision when coughing by placing a pillow or rolled up towels firmly against it. Once you are able to get out of bed, walk around indoors and cough well. You may stop using the incentive spirometer when instructed by your caregiver.  RISKS AND COMPLICATIONS  Take your time so you do not get dizzy or light-headed.  If you are in pain, you may need  to take or ask for pain medication before doing incentive spirometry. It is harder to take a deep breath if you  are having pain. AFTER USE  Rest and breathe slowly and easily.  It can be helpful to keep track of a log of your progress. Your caregiver can provide you with a simple table to help with this. If you are using the spirometer at home, follow these instructions: [place] IF:   You are having difficultly using the spirometer.  You have trouble using the spirometer as often as instructed.  Your pain medication is not giving enough relief while using the spirometer.  You develop fever of 100.5 F (38.1 C) or higher. SEEK IMMEDIATE MEDICAL CARE IF:   You cough up bloody sputum that had not been present before.  You develop fever of 102 F (38.9 C) or greater.  You develop worsening pain at or near the incision site. MAKE SURE YOU:   Understand these instructions.  Will watch your condition.  Will get help right away if you are not doing well or get worse. Document Released: 09/16/2006 Document Revised: 07/29/2011 Document Reviewed: 11/17/2006 [facility] Patient Information 2014 [place], [place].   ________________________________________________________________________

## 2019-11-12 ENCOUNTER — Encounter (HOSPITAL_COMMUNITY): Payer: Self-pay

## 2019-11-12 ENCOUNTER — Encounter (HOSPITAL_COMMUNITY)
Admission: RE | Admit: 2019-11-12 | Discharge: 2019-11-12 | Disposition: A | Discharge status: Home / Self Care | Payer: Managed Care, Other (non HMO) | Source: Ambulatory Visit | Attending: Gynecologic Oncology | Admitting: Gynecologic Oncology

## 2019-11-12 ENCOUNTER — Other Ambulatory Visit: Payer: Self-pay

## 2019-11-12 DIAGNOSIS — Z01818 Encounter for other preprocedural examination: Secondary | ICD-10-CM | POA: Diagnosis not present

## 2019-11-12 HISTORY — DX: Anxiety disorder, unspecified: F41.9

## 2019-11-12 HISTORY — DX: Family history of other specified conditions: Z84.89

## 2019-11-12 HISTORY — DX: Gastro-esophageal reflux disease without esophagitis: K21.9

## 2019-11-12 NOTE — Progress Notes (Signed)
Spoke with Theresa Christensen Coordinator at [facility] and made her aware of 11/25/19 surgery , arrival time of 0530am and surgery time 0730-0830 .  Order for fetal heart tones.  Patient in 2nd trimester.  Theresa Christensen has made a note and stated she will inform [facility] nurse as well about upcoming surgery. Phone number for [facility] Coordinator at [facility] is [phone] from 7a-7p. This is a I phone.

## 2019-11-16 ENCOUNTER — Telehealth: Payer: Self-pay | Admitting: *Deleted

## 2019-11-16 NOTE — Telephone Encounter (Signed)
Per request, fax office note to [employer]

## 2019-11-16 NOTE — Telephone Encounter (Signed)
I spoke with [name] regarding her planned dates for FMLA. The dates are July 8-July 26. She will call with any questions or concerns.

## 2019-11-17 ENCOUNTER — Telehealth: Payer: Self-pay | Admitting: Oncology

## 2019-11-17 ENCOUNTER — Encounter (HOSPITAL_COMMUNITY)
Admission: RE | Admit: 2019-11-17 | Discharge: 2019-11-17 | Disposition: A | Discharge status: Home / Self Care | Payer: Managed Care, Other (non HMO) | Source: Ambulatory Visit | Attending: Gynecologic Oncology | Admitting: Gynecologic Oncology

## 2019-11-17 ENCOUNTER — Other Ambulatory Visit: Payer: Self-pay

## 2019-11-17 DIAGNOSIS — Z01812 Encounter for preprocedural laboratory examination: Secondary | ICD-10-CM | POA: Diagnosis not present

## 2019-11-17 LAB — BASIC METABOLIC PANEL
Anion gap: 9 (ref 5–15)
BUN: 13 mg/dL (ref 6–20)
CO2: 21 mmol/L — ABNORMAL LOW (ref 22–32)
Calcium: 8.8 mg/dL — ABNORMAL LOW (ref 8.9–10.3)
Chloride: 106 mmol/L (ref 98–111)
Creatinine, Ser: 0.49 mg/dL (ref 0.44–1.00)
GFR calc Af Amer: >60 mL/min (ref >60)
GFR calc non Af Amer: >60 mL/min (ref >60)
Glucose, Bld: 90 mg/dL (ref 70–99)
Potassium: 4 mmol/L (ref 3.5–5.1)
Sodium: 136 mmol/L (ref 135–145)

## 2019-11-17 LAB — CBC
HCT: 30.7 % — ABNORMAL LOW (ref 36.0–46.0)
Hemoglobin: 10.9 g/dL — ABNORMAL LOW (ref 12.0–15.0)
MCH: 32.2 pg (ref 26.0–34.0)
MCHC: 35.5 g/dL (ref 30.0–36.0)
MCV: 90.6 fL (ref 80.0–100.0)
Platelets: 221 10*3/uL (ref 150–400)
RBC: 3.39 MIL/uL — ABNORMAL LOW (ref 3.87–5.11)
RDW: 13.2 % (ref 11.5–15.5)
WBC: 8.3 10*3/uL (ref 4.0–10.5)
nRBC: 0 % (ref 0.0–0.2)

## 2019-11-17 NOTE — Telephone Encounter (Signed)
Renita Papa, RN with [facility] regarding need for fetal heart tones preop and postop for 11/26/19 surgery.  She will arrange them for 11/25/19.

## 2019-11-18 NOTE — Progress Notes (Signed)
Anesthesia Chart Review:   Case: [id number] Date/Time: 11/25/19 0715   Procedures:      XI ROBOTIC ASSISTED LAPAROSCOPIC UNILATERAL OVARIAN CYSTECTOMY (N/A )     XI ROBOTIC ASSISTED UNILATERAL OOPHORECTOMY (N/A )   Anesthesia type: General   Pre-op diagnosis: RIGHT OVARIAN CYST AND PREGNANCY   Location: [facility] ROOM 02 / [facility]   Surgeons: Adolphus Birchwood, MD      DISCUSSION: Pt is a 29 year old with hx heart murmur as a child (physical exams documented in Epic do not document murmur).   Pt is in 2nd trimester of pregnancy. Will be [redacted]w[redacted]d at time of surgery. Dr. Henderson Cloud has ordered fetal heart tones be assessed before and after surgery. Renaldo Reel, RN has notified [facility] of surgery date/time   VS: BP (!) (P) 117/57    Pulse (P) 68    Resp (!) (P) 189    Ht (P) 5\' 5"  (1.651 m)    Wt (P) 83.2 kg    SpO2 (P) 100%    BMI (P) 30.54 kg/m    PROVIDERS: Patient, No Pcp Per  - Primary OB is Dr.   LABS: Labs reviewed: Acceptable for surgery. (all labs ordered are listed, but only abnormal results are displayed)  Labs Reviewed  BASIC METABOLIC PANEL - Abnormal; Notable for the following components:      Result Value   CO2 21 (*)    Calcium 8.8 (*)    All other components within normal limits  CBC - Abnormal; Notable for the following components:   RBC 3.39 (*)    Hemoglobin 10.9 (*)    HCT 30.7 (*)    All other components within normal limits    EKG: N/A  CV: N/A  Past Medical History:  Diagnosis Date   Anxiety    Family history of adverse reaction to anesthesia    mom has nausea   GERD (gastroesophageal reflux disease)    Heart murmur    when younger told had a heart mummur no problems   Migraines     Past Surgical History:  Procedure Laterality Date   LAPAROSCOPY     Procedure on Retina     WISDOM TOOTH EXTRACTION      MEDICATIONS:  oxyCODONE (OXY IR/ROXICODONE) 5 MG immediate release tablet   Prenatal Vit-Fe Fumarate-FA (PRENATAL PO)   No  current facility-administered medications for this encounter.    If no changes, I anticipate pt can proceed with surgery as scheduled.   Henderson Cloud, FNP-BC [facility] Short Stay Surgical Center/Anesthesiology Phone: [phone] 11/18/2019 3:03 PM

## 2019-11-18 NOTE — Anesthesia Preprocedure Evaluation (Addendum)
Anesthesia Evaluation  Patient identified by MRN, date of birth, ID band Patient awake    Reviewed: Allergy & Precautions, NPO status , Patient's Chart, lab work & pertinent test results  Airway Mallampati: III  TM Distance: >3 FB Neck ROM: Full    Dental  (+) Teeth Intact, Dental Advisory Given   Pulmonary    breath sounds clear to auscultation       Cardiovascular negative cardio ROS   Rhythm:Regular Rate:Normal     Neuro/Psych  Headaches, PSYCHIATRIC DISORDERS Anxiety    GI/Hepatic Neg liver ROS, GERD  ,  Endo/Other  negative endocrine ROS  Renal/GU negative Renal ROS     Musculoskeletal negative musculoskeletal ROS (+)   Abdominal Normal abdominal exam  (+)   Peds  Hematology negative hematology ROS (+)   Anesthesia Other Findings   Reproductive/Obstetrics                            Anesthesia Physical Anesthesia Plan  ASA: II  Anesthesia Plan: General   Post-op Pain Management:    Induction: Intravenous  PONV Risk Score and Plan: 4 or greater and Ondansetron, Dexamethasone and Treatment may vary due to age or medical condition  Airway Management Planned: Oral ETT  Additional Equipment: None  Intra-op Plan:   Post-operative Plan: Extubation in OR  Informed Consent: I have reviewed the patients History and Physical, chart, labs and discussed the procedure including the risks, benefits and alternatives for the proposed anesthesia with the patient or authorized representative who has indicated his/her understanding and acceptance.     Dental advisory given  Plan Discussed with: CRNA  Anesthesia Plan Comments: (See APP note by Joslyn Hy, FNP. Pt will be [redacted]w[redacted]d pregnant on 11/25/19.   Avoid Midazolam. 2nd IV. Atropine/Neostigmine. Fetal HR 150's)      Anesthesia Quick Evaluation

## 2019-11-23 ENCOUNTER — Other Ambulatory Visit (HOSPITAL_COMMUNITY)
Admission: RE | Admit: 2019-11-23 | Discharge: 2019-11-23 | Disposition: A | Discharge status: Home / Self Care | Payer: Managed Care, Other (non HMO) | Source: Ambulatory Visit | Attending: Gynecologic Oncology | Admitting: Gynecologic Oncology

## 2019-11-23 DIAGNOSIS — Z01812 Encounter for preprocedural laboratory examination: Secondary | ICD-10-CM | POA: Diagnosis not present

## 2019-11-23 DIAGNOSIS — Z20822 Contact with and (suspected) exposure to covid-19: Secondary | ICD-10-CM | POA: Diagnosis not present

## 2019-11-23 LAB — SARS CORONAVIRUS 2 (TAT 6-24 HRS): SARS Coronavirus 2: NEGATIVE

## 2019-11-24 ENCOUNTER — Encounter (HOSPITAL_COMMUNITY): Payer: Self-pay | Admitting: Gynecologic Oncology

## 2019-11-24 ENCOUNTER — Telehealth: Payer: Self-pay

## 2019-11-24 NOTE — Telephone Encounter (Signed)
Spoke with patient to see if she had any questions concerning pre op instructions.  Patient reported she understands directions and has no questions at this time.  Nurse encouraged pt to call back if she needs any clarification.

## 2019-11-24 NOTE — Progress Notes (Signed)
Called OB/GYN rapid response team for pre and post surgery fetal heart tones for day of surgery. Spoke with Denny Peon to set this up.

## 2019-11-25 ENCOUNTER — Ambulatory Visit (HOSPITAL_COMMUNITY): Payer: Managed Care, Other (non HMO) | Admitting: Emergency Medicine

## 2019-11-25 ENCOUNTER — Encounter (HOSPITAL_COMMUNITY)
Admission: RE | Disposition: A | Discharge status: Home / Self Care | Payer: Self-pay | Source: Home / Self Care | Attending: Gynecologic Oncology

## 2019-11-25 ENCOUNTER — Encounter (HOSPITAL_COMMUNITY): Payer: Self-pay | Admitting: Gynecologic Oncology

## 2019-11-25 ENCOUNTER — Ambulatory Visit (HOSPITAL_COMMUNITY): Payer: Managed Care, Other (non HMO) | Admitting: Certified Registered Nurse Anesthetist

## 2019-11-25 ENCOUNTER — Ambulatory Visit (HOSPITAL_COMMUNITY)
Admission: RE | Admit: 2019-11-25 | Discharge: 2019-11-25 | Disposition: A | Discharge status: Home / Self Care | Payer: Managed Care, Other (non HMO) | Attending: Gynecologic Oncology | Admitting: Gynecologic Oncology

## 2019-11-25 DIAGNOSIS — N83201 Unspecified ovarian cyst, right side: Secondary | ICD-10-CM | POA: Diagnosis not present

## 2019-11-25 DIAGNOSIS — O3482 Maternal care for other abnormalities of pelvic organs, second trimester: Secondary | ICD-10-CM | POA: Insufficient documentation

## 2019-11-25 DIAGNOSIS — N8301 Follicular cyst of right ovary: Secondary | ICD-10-CM | POA: Diagnosis not present

## 2019-11-25 DIAGNOSIS — Z3A14 14 weeks gestation of pregnancy: Secondary | ICD-10-CM | POA: Insufficient documentation

## 2019-11-25 DIAGNOSIS — Z880 Allergy status to penicillin: Secondary | ICD-10-CM | POA: Diagnosis not present

## 2019-11-25 DIAGNOSIS — Z3492 Encounter for supervision of normal pregnancy, unspecified, second trimester: Secondary | ICD-10-CM

## 2019-11-25 DIAGNOSIS — Z3491 Encounter for supervision of normal pregnancy, unspecified, first trimester: Secondary | ICD-10-CM

## 2019-11-25 HISTORY — PX: ROBOTIC ASSISTED LAPAROSCOPIC OVARIAN CYSTECTOMY: SHX6081

## 2019-11-25 LAB — TYPE AND SCREEN
ABO/RH(D): A POS
Antibody Screen: NEGATIVE
Unit division: 0

## 2019-11-25 LAB — BPAM RBC
Blood Product Expiration Date: 202107232359
Unit Type and Rh: 6200

## 2019-11-25 LAB — URINALYSIS, ROUTINE W REFLEX MICROSCOPIC
Bilirubin Urine: NEGATIVE
Glucose, UA: NEGATIVE mg/dL
Ketones, ur: 20 mg/dL — AB
Nitrite: NEGATIVE
Protein, ur: 30 mg/dL — AB
Specific Gravity, Urine: 1.027 (ref 1.005–1.030)
pH: 5 (ref 5.0–8.0)

## 2019-11-25 LAB — ABO/RH: ABO/RH(D): A POS

## 2019-11-25 SURGERY — EXCISION, CYST, OVARY, ROBOT-ASSISTED, LAPAROSCOPIC
Anesthesia: General

## 2019-11-25 MED ORDER — SUCCINYLCHOLINE CHLORIDE 200 MG/10ML IV SOSY
PREFILLED_SYRINGE | INTRAVENOUS | Status: DC | PRN
  Administered 2019-11-25: 120 mg via INTRAVENOUS

## 2019-11-25 MED ORDER — FENTANYL CITRATE (PF) 100 MCG/2ML IJ SOLN
INTRAMUSCULAR | Status: AC
  Administered 2019-11-25: 50 ug via INTRAVENOUS
  Filled 2019-11-25: qty 2

## 2019-11-25 MED ORDER — OXYCODONE HCL 5 MG PO TABS
5.0000 mg | ORAL_TABLET | ORAL | Status: DC | PRN
  Administered 2019-11-25: 5 mg via ORAL

## 2019-11-25 MED ORDER — FENTANYL CITRATE (PF) 250 MCG/5ML IJ SOLN
INTRAMUSCULAR | Status: AC
  Filled 2019-11-25: qty 5

## 2019-11-25 MED ORDER — SODIUM CHLORIDE 0.9% FLUSH: 3.0000 mL | INTRAVENOUS | Status: DC | PRN

## 2019-11-25 MED ORDER — LIDOCAINE 2% (20 MG/ML) 5 ML SYRINGE
INTRAMUSCULAR | Status: DC | PRN
  Administered 2019-11-25: 40 mg via INTRAVENOUS

## 2019-11-25 MED ORDER — PROMETHAZINE HCL 25 MG/ML IJ SOLN: 6.2500 mg | INTRAMUSCULAR | Status: DC | PRN

## 2019-11-25 MED ORDER — OXYCODONE HCL 5 MG PO TABS
ORAL_TABLET | ORAL | Status: AC
  Filled 2019-11-25: qty 1

## 2019-11-25 MED ORDER — LIDOCAINE 2% (20 MG/ML) 5 ML SYRINGE
INTRAMUSCULAR | Status: AC
  Filled 2019-11-25: qty 5

## 2019-11-25 MED ORDER — FENTANYL CITRATE (PF) 100 MCG/2ML IJ SOLN: INTRAMUSCULAR | Status: DC | PRN

## 2019-11-25 MED ORDER — SODIUM CHLORIDE 0.9% FLUSH: 3.0000 mL | Freq: Two times a day (BID) | INTRAVENOUS | Status: DC

## 2019-11-25 MED ORDER — FENTANYL CITRATE (PF) 250 MCG/5ML IJ SOLN
INTRAMUSCULAR | Status: DC | PRN
  Administered 2019-11-25 (×3): 50 ug via INTRAVENOUS

## 2019-11-25 MED ORDER — ACETAMINOPHEN 500 MG PO TABS
1000.0000 mg | ORAL_TABLET | ORAL | Status: AC
  Administered 2019-11-25: 1000 mg via ORAL
  Filled 2019-11-25: qty 2

## 2019-11-25 MED ORDER — PROPOFOL 10 MG/ML IV BOLUS
INTRAVENOUS | Status: AC
  Filled 2019-11-25: qty 20

## 2019-11-25 MED ORDER — NEOSTIGMINE METHYLSULFATE 10 MG/10ML IV SOLN
INTRAVENOUS | Status: DC | PRN
  Administered 2019-11-25: 3 mg via INTRAVENOUS

## 2019-11-25 MED ORDER — PROPOFOL 10 MG/ML IV BOLUS
INTRAVENOUS | Status: DC | PRN
  Administered 2019-11-25: 130 mg via INTRAVENOUS

## 2019-11-25 MED ORDER — FENTANYL CITRATE (PF) 100 MCG/2ML IJ SOLN
25.0000 ug | INTRAMUSCULAR | Status: DC | PRN
  Administered 2019-11-25: 50 ug via INTRAVENOUS

## 2019-11-25 MED ORDER — ROCURONIUM BROMIDE 10 MG/ML (PF) SYRINGE
PREFILLED_SYRINGE | INTRAVENOUS | Status: DC | PRN
  Administered 2019-11-25: 40 mg via INTRAVENOUS

## 2019-11-25 MED ORDER — STERILE WATER FOR IRRIGATION IR SOLN
Status: DC | PRN
  Administered 2019-11-25: 1000 mL

## 2019-11-25 MED ORDER — ONDANSETRON HCL 4 MG/2ML IJ SOLN
INTRAMUSCULAR | Status: AC
  Filled 2019-11-25: qty 2

## 2019-11-25 MED ORDER — SODIUM CHLORIDE 0.9 % IV SOLN: 250.0000 mL | INTRAVENOUS | Status: DC | PRN

## 2019-11-25 MED ORDER — ACETAMINOPHEN 650 MG RE SUPP
650.0000 mg | RECTAL | Status: DC | PRN
  Filled 2019-11-25: qty 1

## 2019-11-25 MED ORDER — STERILE WATER FOR INJECTION IJ SOLN: INTRAMUSCULAR | Status: DC | PRN

## 2019-11-25 MED ORDER — LACTATED RINGERS IV SOLN: INTRAVENOUS | Status: DC | PRN

## 2019-11-25 MED ORDER — ACETAMINOPHEN 325 MG PO TABS: 325.0000 mg | ORAL_TABLET | Freq: Once | ORAL | Status: DC | PRN

## 2019-11-25 MED ORDER — ACETAMINOPHEN 160 MG/5ML PO SOLN: 325.0000 mg | Freq: Once | ORAL | Status: DC | PRN

## 2019-11-25 MED ORDER — BUPIVACAINE HCL 0.25 % IJ SOLN
INTRAMUSCULAR | Status: DC | PRN
  Administered 2019-11-25: 19 mL

## 2019-11-25 MED ORDER — DEXAMETHASONE SODIUM PHOSPHATE 10 MG/ML IJ SOLN
INTRAMUSCULAR | Status: DC | PRN
  Administered 2019-11-25: 6 mg via INTRAVENOUS

## 2019-11-25 MED ORDER — ACETAMINOPHEN 10 MG/ML IV SOLN: 1000.0000 mg | Freq: Once | INTRAVENOUS | Status: DC | PRN

## 2019-11-25 MED ORDER — LACTATED RINGERS IR SOLN
Status: DC | PRN
  Administered 2019-11-25: 1000 mL

## 2019-11-25 MED ORDER — LACTATED RINGERS IV SOLN: INTRAVENOUS | Status: DC

## 2019-11-25 MED ORDER — ATROPINE SULFATE 0.4 MG/ML IJ SOLN
INTRAMUSCULAR | Status: DC | PRN
Start: 2019-11-25 — End: 2019-11-25
  Administered 2019-11-25: .4 mg via INTRAVENOUS

## 2019-11-25 MED ORDER — ACETAMINOPHEN 325 MG PO TABS: 650.0000 mg | ORAL_TABLET | ORAL | Status: DC | PRN

## 2019-11-25 MED ORDER — ROCURONIUM BROMIDE 10 MG/ML (PF) SYRINGE
PREFILLED_SYRINGE | INTRAVENOUS | Status: AC
  Filled 2019-11-25: qty 10

## 2019-11-25 MED ORDER — BUPIVACAINE HCL 0.25 % IJ SOLN
INTRAMUSCULAR | Status: AC
  Filled 2019-11-25: qty 1

## 2019-11-25 MED ORDER — DEXAMETHASONE SODIUM PHOSPHATE 10 MG/ML IJ SOLN
INTRAMUSCULAR | Status: AC
  Filled 2019-11-25: qty 1

## 2019-11-25 MED ORDER — MORPHINE SULFATE (PF) 4 MG/ML IV SOLN: 2.0000 mg | INTRAVENOUS | Status: DC | PRN

## 2019-11-25 SURGICAL SUPPLY — 64 items
APPLICATOR SURGIFLO ENDO (HEMOSTASIS) ×2 IMPLANT
BACTOSHIELD CHG 4% 4OZ (MISCELLANEOUS) ×1
BAG LAPAROSCOPIC 12 15 PORT 16 (BASKET) IMPLANT
BAG RETRIEVAL 12/15 (BASKET)
BLADE SURG SZ10 CARB STEEL (BLADE) IMPLANT
COVER BACK TABLE 60X90IN (DRAPES) ×2 IMPLANT
COVER TIP SHEARS 8 DVNC (MISCELLANEOUS) ×1 IMPLANT
COVER TIP SHEARS 8MM DA VINCI (MISCELLANEOUS) ×1
COVER WAND RF STERILE (DRAPES) IMPLANT
DECANTER SPIKE VIAL GLASS SM (MISCELLANEOUS) IMPLANT
DERMABOND ADVANCED (GAUZE/BANDAGES/DRESSINGS) ×1
DERMABOND ADVANCED .7 DNX12 (GAUZE/BANDAGES/DRESSINGS) ×1 IMPLANT
DRAPE ARM DVNC X/XI (DISPOSABLE) ×4 IMPLANT
DRAPE COLUMN DVNC XI (DISPOSABLE) ×1 IMPLANT
DRAPE DA VINCI XI ARM (DISPOSABLE) ×4
DRAPE DA VINCI XI COLUMN (DISPOSABLE) ×1
DRAPE SHEET LG 3/4 BI-LAMINATE (DRAPES) ×2 IMPLANT
DRAPE SURG IRRIG POUCH 19X23 (DRAPES) ×2 IMPLANT
DRSG OPSITE POSTOP 4X6 (GAUZE/BANDAGES/DRESSINGS) IMPLANT
DRSG OPSITE POSTOP 4X8 (GAUZE/BANDAGES/DRESSINGS) IMPLANT
ELECT REM PT RETURN 15FT ADLT (MISCELLANEOUS) ×2 IMPLANT
GLOVE BIO SURGEON STRL SZ 6 (GLOVE) ×8 IMPLANT
GLOVE BIO SURGEON STRL SZ 6.5 (GLOVE) ×4 IMPLANT
GOWN STRL REUS W/ TWL LRG LVL3 (GOWN DISPOSABLE) ×4 IMPLANT
GOWN STRL REUS W/TWL LRG LVL3 (GOWN DISPOSABLE) ×8
HOLDER FOLEY CATH W/STRAP (MISCELLANEOUS) ×2 IMPLANT
IRRIG SUCT STRYKERFLOW 2 WTIP (MISCELLANEOUS) ×2
IRRIGATION SUCT STRKRFLW 2 WTP (MISCELLANEOUS) ×1 IMPLANT
KIT PROCEDURE DA VINCI SI (MISCELLANEOUS)
KIT PROCEDURE DVNC SI (MISCELLANEOUS) IMPLANT
KIT TURNOVER KIT A (KITS) IMPLANT
MANIPULATOR UTERINE 4.5 ZUMI (MISCELLANEOUS) ×2 IMPLANT
NEEDLE HYPO 22GX1.5 SAFETY (NEEDLE) ×2 IMPLANT
NEEDLE SPNL 18GX3.5 QUINCKE PK (NEEDLE) IMPLANT
OBTURATOR OPTICAL STANDARD 8MM (TROCAR) ×2
OBTURATOR OPTICAL STND 8 DVNC (TROCAR) ×1
OBTURATOR OPTICALSTD 8 DVNC (TROCAR) ×1 IMPLANT
PACK ROBOT GYN CUSTOM WL (TRAY / TRAY PROCEDURE) ×2 IMPLANT
PAD POSITIONING PINK XL (MISCELLANEOUS) ×2 IMPLANT
PENCIL SMOKE EVACUATOR (MISCELLANEOUS) IMPLANT
PORT ACCESS TROCAR AIRSEAL 12 (TROCAR) ×1 IMPLANT
PORT ACCESS TROCAR AIRSEAL 5M (TROCAR) ×1
POUCH SPECIMEN RETRIEVAL 10MM (ENDOMECHANICALS) IMPLANT
SCRUB CHG 4% DYNA-HEX 4OZ (MISCELLANEOUS) ×1 IMPLANT
SEAL CANN UNIV 5-8 DVNC XI (MISCELLANEOUS) ×3 IMPLANT
SEAL XI 5MM-8MM UNIVERSAL (MISCELLANEOUS) ×3
SET TRI-LUMEN FLTR TB AIRSEAL (TUBING) ×2 IMPLANT
SPONGE LAP 18X18 RF (DISPOSABLE) IMPLANT
SURGIFLO W/THROMBIN 8M KIT (HEMOSTASIS) ×2 IMPLANT
SUT MNCRL AB 4-0 PS2 18 (SUTURE) IMPLANT
SUT PDS AB 1 TP1 96 (SUTURE) IMPLANT
SUT VIC AB 0 CT1 27 (SUTURE)
SUT VIC AB 0 CT1 27XBRD ANTBC (SUTURE) IMPLANT
SUT VIC AB 2-0 CT1 27 (SUTURE)
SUT VIC AB 2-0 CT1 TAPERPNT 27 (SUTURE) IMPLANT
SUT VICRYL 4-0 PS2 18IN ABS (SUTURE) ×4 IMPLANT
SYR 10ML LL (SYRINGE) IMPLANT
TOWEL OR NON WOVEN STRL DISP B (DISPOSABLE) ×2 IMPLANT
TRAP SPECIMEN MUCUS 40CC (MISCELLANEOUS) IMPLANT
TRAY FOLEY MTR SLVR 16FR STAT (SET/KITS/TRAYS/PACK) ×2 IMPLANT
TROCAR XCEL NON-BLD 5MMX100MML (ENDOMECHANICALS) IMPLANT
UNDERPAD 30X36 HEAVY ABSORB (UNDERPADS AND DIAPERS) ×2 IMPLANT
WATER STERILE IRR 1000ML POUR (IV SOLUTION) ×2 IMPLANT
YANKAUER SUCT BULB TIP 10FT TU (MISCELLANEOUS) IMPLANT

## 2019-11-25 NOTE — Anesthesia Postprocedure Evaluation (Signed)
Anesthesia Post Note  Patient: Theresa Christensen  Procedure(s) Performed: XI ROBOTIC ASSISTED LAPAROSCOPIC UNILATERAL OVARIAN CYSTECTOMY/UNILATERALOOPORECTOMY (N/A )     Patient location during evaluation: PACU Anesthesia Type: General Level of consciousness: awake and alert Pain management: pain level controlled Vital Signs Assessment: post-procedure vital signs reviewed and stable Respiratory status: spontaneous breathing, nonlabored ventilation, respiratory function stable and patient connected to nasal cannula oxygen Cardiovascular status: blood pressure returned to baseline and stable Postop Assessment: no apparent nausea or vomiting Anesthetic complications: no   No complications documented.  Last Vitals:  Vitals:   11/25/19 1115 11/25/19 1130  BP: 111/60 103/68  Pulse: 68 65  Resp: 18 18  Temp:    SpO2: 100% 100%    Last Pain:  Vitals:   11/25/19 1115  TempSrc:   PainSc: 2                  Shelton Silvas

## 2019-11-25 NOTE — Discharge Instructions (Signed)
Return to work: 3 days to 1 week.  Activity: 1. Be up and out of the bed during the day.  Take a nap if needed.  You may walk up steps but be careful and use the hand rail.  Stair climbing will tire you more than you think, you may need to stop part way and rest.   2. No lifting or straining for 2 weeks.  3. No driving for 3 days  Do Not drive if you are taking narcotic pain medicine.  4. Shower daily.  Use soap and water on your incision and pat dry; don't rub.   5. No sexual activity and nothing in the vagina for 6 weeks.  Medications:   - While taking percocet you should take sennakot every night to reduce the likelihood of constipation. If this causes diarrhea, stop its use.  Diet: 1. Low sodium Heart Healthy Diet is recommended.  2. It is safe to use a laxative if you have difficulty moving your bowels.   Wound Care: 1. Keep clean and dry.  Shower daily.  Reasons to call the Doctor:   Fever - Oral temperature greater than 100.4 degrees Fahrenheit  Foul-smelling vaginal discharge  Difficulty urinating  Nausea and vomiting  Increased pain at the site of the incision that is unrelieved with pain medicine.  Difficulty breathing with or without chest pain  New calf pain especially if only on one side  Sudden, continuing increased vaginal bleeding with or without clots.   Follow-up: 1. See Adolphus Birchwood in 4 weeks.  Contacts: For questions or concerns you should contact:  Dr. Adolphus Birchwood at [phone] After hours and on week-ends call [phone] and ask to speak to the physician on call for Gynecologic Oncology   Ovarian Cystectomy, Care After This sheet gives you information about how to care for yourself after your procedure. Your health care provider may also give you more specific instructions. If you have problems or questions, contact your health care provider. What can I expect after the procedure? After the procedure, it is common to have:  Pain in  the abdomen, especially at the incision areas. You will be given pain medicines to control the pain.  Tiredness. This is a normal part of the recovery process. Your energy level will return to normal over the next several weeks. Follow these instructions at home: Medicines  Take over-the-counter and prescription medicines only as told by your health care provider.  If you were prescribed an antibiotic medicine, use it as told by your health care provider. Do not stop using the antibiotic even if you start to feel better.  Do not take aspirin because it can cause bleeding.  Ask your health care provider if the medicine prescribed to you: ? Requires you to avoid driving or using heavy machinery. ? Can cause constipation. You may need to take these actions to prevent or treat constipation:  Drink enough fluid to keep your urine pale yellow.  Take over-the-counter or prescription medicines.  Eat foods that are high in fiber, such as beans, whole grains, and fresh fruits and vegetables.  Limit foods that are high in fat and processed sugars, such as fried or sweet foods. Incision care   Follow instructions from your health care provider about how to take care of your incisions. Make sure you: ? Wash your hands with soap and water before and after you change your bandage (dressing). If soap and water are not available, use hand sanitizer. ?  Change your dressing as told by your health care provider. ? Leave stitches (sutures), skin glue, or adhesive strips in place. These skin closures may need to stay in place for 2 weeks or longer. If adhesive strip edges start to loosen and curl up, you may trim the loose edges. Do not remove adhesive strips completely unless your health care provider tells you to do that.  Check your incision areas every day for signs of infection. Check for: ? Redness, swelling, or pain. ? Fluid or blood. ? Warmth. ? Pus or a bad smell.  Do not take baths, swim, or  use a hot tub until your health care provider approves. Ask your health care provider if you may take showers. You may only be allowed to take sponge baths. Activity  Rest as told by your health care provider.  Avoid sitting for a long time without moving. Get up to take short walks every 1-2 hours. This is important to improve blood flow and breathing. Ask for help if you feel weak or unsteady.  Do not lift anything that is heavier than 10 lb (4.5 kg), or the limit that you are told, until your health care provider says that it is safe.  Return to your normal activities and diet as told by your health care provider. Ask your health care provider what activities are safe for you. General instructions  Do not douche, use tampons, or have sexual intercourse until your health care provider says it is okay to do so.  Do not use any products that contain nicotine or tobacco, such as cigarettes, e-cigarettes, and chewing tobacco. These can delay incision healing after surgery. If you need help quitting, ask your health care provider.  Keep all follow-up visits as told by your health care provider. This is important. Contact a health care provider if:  You have a fever.  You feel nauseous or you vomit.  You have pain when you urinate or have blood in your urine.  You have a rash on your body.  You have pain or redness where the IV was inserted.  You have pain that is not relieved with medicine.  You have any of these signs of infection: ? Redness, swelling, or pain around your incisions. ? Fluid or blood coming from your incisions. ? Warmth coming from an incision. ? Pus or a bad smell coming from your incisions. Get help right away if:  You have chest pain or shortness of breath.  You feel dizzy or light-headed.  You have heavy bleeding.  You have increasing abdominal pain that is not relieved with medicines.  You have pain, swelling, or redness in your leg.  Your incision  is opening (the edges are not staying together). Summary  After the procedure, it is common to have some pain in your abdomen. You will be given pain medicines to control the pain.  Follow instructions from your health care provider about how to take care of your incisions.  Do not douche, use tampons, or have sexual intercourse until your health care provider says it is okay to do so.  Keep all follow-up visits as told by your health care provider. This is important. This information is not intended to replace advice given to you by your health care provider. Make sure you discuss any questions you have with your health care provider. Document Revised: 12/03/2018 Document Reviewed: 12/03/2018 Elsevier Patient Education  2020 [employer].

## 2019-11-25 NOTE — Transfer of Care (Signed)
Immediate Anesthesia Transfer of Care Note  Patient: Theresa Christensen  Procedure(s) Performed: XI ROBOTIC ASSISTED LAPAROSCOPIC UNILATERAL OVARIAN CYSTECTOMY/UNILATERALOOPORECTOMY (N/A )  Patient Location: PACU  Anesthesia Type:General  Level of Consciousness: awake and patient cooperative  Airway & Oxygen Therapy: Patient Spontanous Breathing and Patient connected to face mask oxygen  Post-op Assessment: Report given to RN and Post -op Vital signs reviewed and stable  Post vital signs: Reviewed and stable  Last Vitals:  Vitals Value Taken Time  BP 95/60 11/25/19 0857  Temp    Pulse 58 11/25/19 0859  Resp 14 11/25/19 0859  SpO2 100 % 11/25/19 0859  Vitals shown include unvalidated device data.  Last Pain:  Vitals:   11/25/19 0614  TempSrc:   PainSc: 0-No pain         Complications: No complications documented.

## 2019-11-25 NOTE — Op Note (Signed)
OPERATIVE NOTE  Date: 11/25/19  Preoperative Diagnosis: right ovarian cyst   Postoperative Diagnosis:  same  Procedure(s) Performed: Robotic-assisted right ovarian cystectomy  Surgeon: Adolphus Birchwood, M.D.  Assistant Surgeon: Antionette Char M.D. (an MD assistant was necessary for tissue manipulation, management of robotic instrumentation, retraction and positioning due to the complexity of the case and hospital policies).   Anesthesia: Gen. endotracheal.  Specimens: washings, right ovarian cyst  Estimated Blood Loss: <10 mL. Blood Replacement: None  Complications: none  Indication for Procedure:  [redacted] weeks pregnant, enlarging right ovarian simple cyst (10cm)  Operative Findings: normal appearing gravid uterus, unable to visualize left ovary, normal appendix and upper abdomen. 10cm right ovarian cyst (simple).  Procedure: Fetal heart tones were obtained preoperatively. The patient's taken to the operating room and placed under general endotracheal anesthesia testing difficulty. She is placed in a dorsolithotomy position and cervical acromial pad was placed. The arms were tucked with care taken to pad the olecranon process. And prepped and draped in usual sterile fashion. A 65mm incision was made in the left upper quadrant palmer's point and a 5 mm Optiview trocar used to enter the abdomen under direct visualization. No trauma to abdominal or pelvic viscera occurred. With entry into the abdomen and then maintenance of 15 mm of mercury the patient was placed in Trendelenburg position and aeroplaned slightly to the left to expose the right ovary. An incision was made in the umbilicus and a 53mm trochar was placed through this site. Two incisions were made lateral to the umbilical incision in the left and right abdomen measuring 77mm. These incisions were made approximately 10 cm lateral to the umbilical incision. 8 mm robotic trochars were inserted. The robot was docked.  The abdomen was inspected  as was the pelvis.  Pelvic washings were obtained. An incision was made in the right ovarian cortex overlying the cyst and unavoidable cyst rupture ensued. The fluid was aspirated.  The ovarian cyst wall was carefully separated from the overlying cortex with bipolar used for hemostasis as we proceded. When the capsule had been separated from the cyst wall, the cyst wall was placed in an endocatch bag and retrieved from the left upper quadrant. Hemostasis was confirmed in the ovarian bed. This was reinforced with surgiflow.  The robot was undocked.  The ports were all removed. The fascial closure at the umbilical incision and left upper quadrant port was made with 0 Vicryl.  All incisions were closed with a running subcuticular Monocryl suture. Dermabond was applied. Sponge, lap and needle counts were correct x 3.    The bedside assistant was required during my time at the robotic console for tissue manipulation and to deploy/provide instrumentation in addition to port placement and abdominal wall closure.   The patient had sequential compression devices for VTE prophylaxis.         Disposition: PACU -stable         Condition: stable  Quinn Axe, MD

## 2019-11-25 NOTE — H&P (Signed)
H&P Note: Gyn-Onc  Consult was requested by Dr. Henderson Christensen for the evaluation of Theresa Christensen 29 y.o. female  CC:  10cm right ovarian cyst in pregnancy  Assessment/Plan:  [name]  is a 29 y.o.  year old G1P0 at 81 weeks and 3 days gestational age ([facility] 05/22/20) who has an enlarging 9.5cm simple right ovarian cyst.  The mass is asymptomatic however due to its enlarging size and concern for the development of acute pain in pregnancy and the potential for need of an emergent surgery during pregnancy, I am recommending elective surgical excision during this safest point in her pregnancy, the early second trimester.  I explained to the patient that surgery in pregnancy is not associated with an increased risk for congenital birth defects, and has not been defined to be associated with an increased risk of preterm delivery when surgeries performed for an elective condition such as this a (for example not ruptured appendix or alternative serious underlying medical condition).  I explained that the anesthesia use in pregnancy has not been associated with birth defects.  I explained however that pregnancy is associated with some increased risk perioperatively including gastroesophageal reflux causing aspiration, DVT and PE risk, and increased risk of bleeding due to increased vascularity of pelvic organs.  Additionally there is a potential risk for damage to the gravid uterus particularly with sharp instrumentation or electrosurgery.  I explained that we will be overly cautious to avoid port placement in close proximity to the uterus and during the procedure with manipulation of pelvic organs.  I explained that we will assess fetal heart tones pre and postoperatively.  An alternative to proceeding with surgery would be expectant management with planned repeat ultrasound postpartum and surgical excision at that time if the cyst remained.  The patient is electing to proceed with surgery with  robotic assisted unilateral ovarian cystectomy versus oophorectomy (if a safe cystectomy cannot be accomplished).  Surgery will be scheduled for the first week of her second trimester when she is free from the highest risk for spontaneous loss.  This would be time for the first couple of weeks of July.  HPI: [name] is a 29 year old G1, P0 who was seen in consultation at the request of Dr. Huntley Christensen for evaluation of a right adnexal mass in pregnancy.  The patient's estimated due date is May 22, 2020.  This is a desired and planned pregnancy.  She is dated by first trimester ultrasound scan.  She is undergone 3 trimester ultrasound scans the first of which was in September 15, 2019.  This revealed a 3.1 x 1.9 centimeter simple cyst in the right ovary without blood flow.  It was considered to be possibly a corpus luteum cyst.  A repeat ultrasound scan was performed on Sep 27, 2019 at which time an intrauterine pregnancy was seen with a yolk sac but no fetal pole at that time and in the right ovary is 7.8 x 4.7 cm simple appearing cyst was seen without blood flow seen.  Was considered to be either right ovarian or possibly paraovarian.  No left adnexal masses were seen.  A follow-up ultrasound was again performed in the last week of May 2021 and confirmed that the cyst was increasing again this time to 9.7 cm.  The patient is an otherwise fairly healthy woman.  She has had surgeries including bilateral retinal detachment surgery and a laparoscopy for endometriosis ablation.  She is never been hospitalized.  She has no known bleeding  diatheses or history of VTE.  Her family history is significant for a father who died from lung cancer, and a maternal great aunt with breast cancer.  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  Current Meds:  Outpatient Encounter Medications as of 10/21/2019  Medication Sig  . [DISCONTINUED] SRONYX 0.1-20 MG-MCG tablet Take 1 tablet by mouth daily.   No  facility-administered encounter medications on file as of 10/21/2019.    Allergy:  Allergies  Allergen Reactions  . Penicillins Itching    Rash with minor swelling (CHILDHOOD REACTION)    Social Hx:   Social History   Socioeconomic History  . Marital status: Single    Spouse name: Not on file  . Number of children: Not on file  . Years of education: Not on file  . Highest education level: Not on file  Occupational History  . Not on file  Tobacco Use  . Smoking status: Never Smoker  . Smokeless tobacco: Never Used  Vaping Use  . Vaping Use: Never used  Substance and Sexual Activity  . Alcohol use: Not Currently    Alcohol/week: 0.0 standard drinks    Comment: occassional  . Drug use: Not Currently  . Sexual activity: Yes    Birth control/protection: None    Comment: pregnant  Other Topics Concern  . Not on file  Social History Narrative  . Not on file   Social Determinants of Health   Financial Resource Strain:   . Difficulty of Paying Living Expenses:   Food Insecurity:   . Worried About Programme researcher, broadcasting/film/video in the Last Year:   . Barista in the Last Year:   Transportation Needs:   . Freight forwarder (Medical):   Marland Kitchen Lack of Transportation (Non-Medical):   Physical Activity:   . Days of Exercise per Week:   . Minutes of Exercise per Session:   Stress:   . Feeling of Stress :   Social Connections:   . Frequency of Communication with Friends and Family:   . Frequency of Social Gatherings with Friends and Family:   . Attends Religious Services:   . Active Member of Clubs or Organizations:   . Attends Banker Meetings:   Marland Kitchen Marital Status:   Intimate Partner Violence:   . Fear of Current or Ex-Partner:   . Emotionally Abused:   Marland Kitchen Physically Abused:   . Sexually Abused:     Past Surgical Hx:  Past Surgical History:  Procedure Laterality Date  . LAPAROSCOPY    . Procedure on Retina    . WISDOM TOOTH EXTRACTION      Past Medical  Hx:  Past Medical History:  Diagnosis Date  . Anxiety   . Family history of adverse reaction to anesthesia    mom has nausea  . GERD (gastroesophageal reflux disease)   . Heart murmur    when younger told had a heart mummur no problems  . Migraines     Past Gynecological History:  See HPI No LMP recorded. Patient is pregnant. LMP April, 2021; [facility] 05/22/20.  Family Hx:  Family History  Problem Relation Age of Onset  . Heart disease Other   . Cancer Father        lung  . Heart disease Maternal Grandmother   . Diabetes Maternal Grandfather   . Heart disease Maternal Grandfather   . Cancer Maternal Aunt        breast    Review of  Systems:  Constitutional  Feels well,    ENT Normal appearing ears and nares bilaterally Skin/Breast  No rash, sores, jaundice, itching, dryness Cardiovascular  No chest pain, shortness of breath, or edema  Pulmonary  No cough or wheeze.  Gastro Intestinal  No nausea, vomitting, or diarrhoea. No bright red blood per rectum, no abdominal pain, change in bowel movement, or constipation.  Genito Urinary  No frequency, urgency, dysuria,  Musculo Skeletal  No myalgia, arthralgia, joint swelling or pain  Neurologic  No weakness, numbness, change in gait,  Psychology  No depression, anxiety, insomnia.   Vitals:  Blood pressure 108/63, pulse 85, temperature 98 F (36.7 C), temperature source Oral, resp. rate 16, height 5\' 5"  (1.651 m), weight 183 lb 8 oz (83.2 kg), SpO2 100 %.  Physical Exam: WD in NAD Neck  Supple NROM, without any enlargements.  Lymph Node Survey No cervical supraclavicular or inguinal adenopathy Cardiovascular  Pulse normal rate, regularity and rhythm. S1 and S2 normal.  Lungs  Clear to auscultation bilateraly, without wheezes/crackles/rhonchi. Good air movement.  Skin  No rash/lesions/breakdown  Psychiatry  Alert and oriented to person, place, and time  Abdomen  Normoactive bowel sounds, abdomen soft, non-tender and  mildly overweight without evidence of hernia.  Back No CVA tenderness Genito Urinary  Vulva/vagina: Normal external female genitalia.  No lesions. No discharge or bleeding.  Bladder/urethra:  No lesions or masses, well supported bladder  Vagina: smooth, no palpable masses  Cervix: 2cm, closed, firm  Uterus:  Small, mobile, there is some nodularity posterior to the lower uterine segment  Adnexa: unable to discretely palpate masses. Rectal  Some posterior uterine nodularity noted.  Extremities  No bilateral cyanosis, clubbing or edema.   , MD  11/25/2019, 7:03 AM

## 2019-11-25 NOTE — Progress Notes (Signed)
OBRRN to doppler pre-op pt here for Robotic Assisted Laparoscopic Unilateral Ovarian Cystectomy/ Unilateral Oopherectomy. Abdomen soft to palpation, no OB complaints. Fetal heart tones dopplered in 150s.

## 2019-11-25 NOTE — Anesthesia Procedure Notes (Addendum)
Procedure Name: Intubation Date/Time: 11/25/2019 7:36 AM Performed by: West Pugh, CRNA Pre-anesthesia Checklist: Patient identified, Emergency Drugs available, Suction available, Patient being monitored and Timeout performed Patient Re-evaluated:Patient Re-evaluated prior to induction Oxygen Delivery Method: Circle system utilized Preoxygenation: Pre-oxygenation with 100% oxygen Induction Type: IV induction, Cricoid Pressure applied and Rapid sequence Laryngoscope Size: Mac and 3 Grade View: Grade I Tube type: Oral Tube size: 7.0 mm Number of attempts: 1 Airway Equipment and Method: Stylet Placement Confirmation: ETT inserted through vocal cords under direct vision,  positive ETCO2,  CO2 detector and breath sounds checked- equal and bilateral Secured at: 20 cm Tube secured with: Tape Dental Injury: Teeth and Oropharynx as per pre-operative assessment

## 2019-11-25 NOTE — Progress Notes (Signed)
0920 Post op doppler FHR completed. FHR [id number].

## 2019-11-26 ENCOUNTER — Telehealth: Payer: Self-pay

## 2019-11-26 ENCOUNTER — Encounter (HOSPITAL_COMMUNITY): Payer: Self-pay | Admitting: Gynecologic Oncology

## 2019-11-26 LAB — CYTOLOGY - NON PAP

## 2019-11-26 LAB — SURGICAL PATHOLOGY

## 2019-11-26 NOTE — Telephone Encounter (Signed)
LM for Theresa Christensen to call the office to check on her to see how is was doing after her surgery yesterday.  Requested that she call the office back to discuss.

## 2019-11-29 NOTE — Telephone Encounter (Signed)
Told Theresa Christensen that the final surgical pathology was benign. Theresa Christensen states she is doing well from the surgery. Her abdomen is sore at times which is to be expected. She knows her post op appointment as well as the office number [phone] if she has any questions or concerns.

## 2019-12-10 ENCOUNTER — Encounter: Payer: Self-pay | Admitting: Gynecologic Oncology

## 2019-12-21 ENCOUNTER — Other Ambulatory Visit: Payer: Self-pay

## 2019-12-21 ENCOUNTER — Encounter: Payer: Self-pay | Admitting: Gynecologic Oncology

## 2019-12-21 ENCOUNTER — Inpatient Hospital Stay: Payer: Managed Care, Other (non HMO) | Attending: Gynecologic Oncology | Admitting: Gynecologic Oncology

## 2019-12-21 VITALS — BP 102/58 | HR 88 | Temp 99.1°F | Resp 17 | Ht 65.0 in | Wt 181.8 lb

## 2019-12-21 DIAGNOSIS — Z3A09 9 weeks gestation of pregnancy: Secondary | ICD-10-CM | POA: Insufficient documentation

## 2019-12-21 DIAGNOSIS — N83201 Unspecified ovarian cyst, right side: Secondary | ICD-10-CM | POA: Diagnosis present

## 2019-12-21 DIAGNOSIS — O3481 Maternal care for other abnormalities of pelvic organs, first trimester: Secondary | ICD-10-CM | POA: Diagnosis present

## 2019-12-21 NOTE — Patient Instructions (Signed)
Please follow-up with Dr Henderson Cloud for scheduled follow-up.  You are cleared to resume all activity (including swimming) next week when you are 4 weeks out from surgery.   Please call Dr Andrey Farmer at [phone] with questions related to your surgery.

## 2019-12-21 NOTE — Progress Notes (Signed)
Follow-up Note: Gyn-Onc  Consult was requested by Dr. Henderson Christensen for the evaluation of Theresa Christensen 29 y.o. female  CC:  Chief Complaint  Patient presents with  . Right ovarian cyst    Post Op    Assessment/Plan:  [name]  is a 29 y.o.  year old G1P0 at [redacted] weeks GA who has a a history of an enlarging 9.5cm simple right ovarian cyst, s/p robotic assisted right ovarian cystectomy on 11/25/19. Surgery was uncomplicated. Pathology was benign. She will resume obstetric care with Dr Theresa Christensen for the remainder of the pregnancy.   HPI: [name] is a 29 year old G1, P0 who was seen in consultation at the request of Dr. Huntley Christensen for evaluation of a right adnexal mass in pregnancy.  The patient's estimated due date is May 22, 2020.  This is a desired and planned pregnancy.  She is dated by first trimester ultrasound scan.  She is undergone 3 trimester ultrasound scans the first of which was in September 15, 2019.  This revealed a 3.1 x 1.9 centimeter simple cyst in the right ovary without blood flow.  It was considered to be possibly a corpus luteum cyst.  A repeat ultrasound scan was performed on Sep 27, 2019 at which time an intrauterine pregnancy was seen with a yolk sac but no fetal pole at that time and in the right ovary is 7.8 x 4.7 cm simple appearing cyst was seen without blood flow seen.  Was considered to be either right ovarian or possibly paraovarian.  No left adnexal masses were seen.  A follow-up ultrasound was again performed in the last week of May 2021 and confirmed that the cyst was increasing again this time to 9.7 cm.  The patient is an otherwise fairly healthy woman.  She has had surgeries including bilateral retinal detachment surgery and a laparoscopy for endometriosis ablation.  She is never been hospitalized.  She has no known bleeding diatheses or history of VTE.  Her family history is significant for a father who died from lung cancer, and a maternal great  aunt with breast cancer.  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  Interval Hx On 11/25/19 she underwent robotic assisted right ovarian cystectomy. Intraoperative findings were significant for a gravid uterus and 10cm right ovarian simple cyst. Surgery was uncomplicated.  Final pathology revealed a benign follicular cyst.  Since surgery she has done well with no specific concerns.   Current Meds:  Outpatient Encounter Medications as of 12/21/2019  Medication Sig  . Prenatal Vit-Fe Fumarate-FA (PRENATAL PO) Take 1 tablet by mouth daily.  . [DISCONTINUED] oxyCODONE (OXY IR/ROXICODONE) 5 MG immediate release tablet Take 1 tablet (5 mg total) by mouth every 4 (four) hours as needed for severe pain. For AFTER surgery, do not take and drive (Patient not taking: Reported on 12/21/2019)   No facility-administered encounter medications on file as of 12/21/2019.    Allergy:  Allergies  Allergen Reactions  . Penicillins Itching    Rash with minor swelling (CHILDHOOD REACTION)    Social Hx:   Social History   Socioeconomic History  . Marital status: Single    Spouse name: Not on file  . Number of children: Not on file  . Years of education: Not on file  . Highest education level: Not on file  Occupational History  . Not on file  Tobacco Use  . Smoking status: Never Smoker  . Smokeless tobacco: Never Used  Vaping Use  .  Vaping Use: Never used  Substance and Sexual Activity  . Alcohol use: Not Currently    Alcohol/week: 0.0 standard drinks    Comment: occassional  . Drug use: Not Currently  . Sexual activity: Yes    Birth control/protection: None    Comment: pregnant  Other Topics Concern  . Not on file  Social History Narrative  . Not on file   Social Determinants of Health   Financial Resource Strain:   . Difficulty of Paying Living Expenses:   Food Insecurity:   . Worried About Programme researcher, broadcasting/film/video in the Last Year:   . Barista in the Last Year:    Transportation Needs:   . Freight forwarder (Medical):   Marland Kitchen Lack of Transportation (Non-Medical):   Physical Activity:   . Days of Exercise per Week:   . Minutes of Exercise per Session:   Stress:   . Feeling of Stress :   Social Connections:   . Frequency of Communication with Friends and Family:   . Frequency of Social Gatherings with Friends and Family:   . Attends Religious Services:   . Active Member of Clubs or Organizations:   . Attends Banker Meetings:   Marland Kitchen Marital Status:   Intimate Partner Violence:   . Fear of Current or Ex-Partner:   . Emotionally Abused:   Marland Kitchen Physically Abused:   . Sexually Abused:     Past Surgical Hx:  Past Surgical History:  Procedure Laterality Date  . LAPAROSCOPY    . Procedure on Retina    . ROBOTIC ASSISTED LAPAROSCOPIC OVARIAN CYSTECTOMY N/A 11/25/2019   Procedure: XI ROBOTIC ASSISTED LAPAROSCOPIC UNILATERAL OVARIAN CYSTECTOMY/UNILATERALOOPORECTOMY;  Surgeon: Adolphus Birchwood, MD;  Location: [facility];  Service: Gynecology;  Laterality: N/A;  . WISDOM TOOTH EXTRACTION      Past Medical Hx:  Past Medical History:  Diagnosis Date  . Anxiety   . Family history of adverse reaction to anesthesia    mom has nausea  . GERD (gastroesophageal reflux disease)   . Heart murmur    when younger told had a heart mummur no problems  . Migraines     Past Gynecological History:  See HPI No LMP recorded. Patient is pregnant. LMP April, 2021; [facility] 05/22/20.  Family Hx:  Family History  Problem Relation Age of Onset  . Heart disease Other   . Cancer Father        lung  . Heart disease Maternal Grandmother   . Diabetes Maternal Grandfather   . Heart disease Maternal Grandfather   . Cancer Maternal Aunt        breast    Review of Systems:  Constitutional  Feels well,    ENT Normal appearing ears and nares bilaterally Skin/Breast  No rash, sores, jaundice, itching, dryness Cardiovascular  No chest pain, shortness of breath, or  edema  Pulmonary  No cough or wheeze.  Gastro Intestinal  No nausea, vomitting, or diarrhoea. No bright red blood per rectum, no abdominal pain, change in bowel movement, or constipation.  Genito Urinary  No frequency, urgency, dysuria,  Musculo Skeletal  No myalgia, arthralgia, joint swelling or pain  Neurologic  No weakness, numbness, change in gait,  Psychology  No depression, anxiety, insomnia.   Vitals:  Blood pressure (!) 102/58, pulse 88, temperature 99.1 F (37.3 C), temperature source Oral, resp. rate 17, height 5\' 5"  (1.651 m), weight 181 lb 12.8 oz (82.5 kg), SpO2 100 %.  Physical Exam: Well  healed incisions. No abd tenderness.    Luisa Dago, MD  12/21/2019, 3:21 PM

## 2020-04-03 ENCOUNTER — Other Ambulatory Visit: Payer: Self-pay

## 2020-04-03 ENCOUNTER — Inpatient Hospital Stay (HOSPITAL_COMMUNITY)
Admission: EM | Admit: 2020-04-03 | Discharge: 2020-04-07 | DRG: 831 | Disposition: A | Discharge status: Home / Self Care | Payer: Managed Care, Other (non HMO) | Attending: Obstetrics and Gynecology | Admitting: Obstetrics and Gynecology

## 2020-04-03 ENCOUNTER — Encounter (HOSPITAL_COMMUNITY): Payer: Self-pay | Admitting: Obstetrics and Gynecology

## 2020-04-03 ENCOUNTER — Emergency Department (HOSPITAL_COMMUNITY): Payer: Managed Care, Other (non HMO)

## 2020-04-03 ENCOUNTER — Inpatient Hospital Stay (HOSPITAL_COMMUNITY): Payer: Managed Care, Other (non HMO)

## 2020-04-03 DIAGNOSIS — M79606 Pain in leg, unspecified: Secondary | ICD-10-CM | POA: Diagnosis present

## 2020-04-03 DIAGNOSIS — O98513 Other viral diseases complicating pregnancy, third trimester: Principal | ICD-10-CM | POA: Diagnosis present

## 2020-04-03 DIAGNOSIS — R059 Cough, unspecified: Secondary | ICD-10-CM | POA: Diagnosis present

## 2020-04-03 DIAGNOSIS — Z3A33 33 weeks gestation of pregnancy: Secondary | ICD-10-CM

## 2020-04-03 DIAGNOSIS — U071 COVID-19: Secondary | ICD-10-CM

## 2020-04-03 DIAGNOSIS — R609 Edema, unspecified: Secondary | ICD-10-CM | POA: Diagnosis not present

## 2020-04-03 DIAGNOSIS — O99891 Other specified diseases and conditions complicating pregnancy: Secondary | ICD-10-CM

## 2020-04-03 DIAGNOSIS — O99513 Diseases of the respiratory system complicating pregnancy, third trimester: Secondary | ICD-10-CM | POA: Diagnosis present

## 2020-04-03 DIAGNOSIS — O26893 Other specified pregnancy related conditions, third trimester: Secondary | ICD-10-CM | POA: Diagnosis present

## 2020-04-03 DIAGNOSIS — J1282 Pneumonia due to coronavirus disease 2019: Secondary | ICD-10-CM | POA: Diagnosis present

## 2020-04-03 DIAGNOSIS — R7989 Other specified abnormal findings of blood chemistry: Secondary | ICD-10-CM | POA: Diagnosis present

## 2020-04-03 DIAGNOSIS — R945 Abnormal results of liver function studies: Secondary | ICD-10-CM

## 2020-04-03 DIAGNOSIS — M7989 Other specified soft tissue disorders: Secondary | ICD-10-CM | POA: Diagnosis not present

## 2020-04-03 HISTORY — DX: COVID-19: U07.1

## 2020-04-03 LAB — CBC
HCT: 31 % — ABNORMAL LOW (ref 36.0–46.0)
Hemoglobin: 10.4 g/dL — ABNORMAL LOW (ref 12.0–15.0)
MCH: 32.7 pg (ref 26.0–34.0)
MCHC: 33.5 g/dL (ref 30.0–36.0)
MCV: 97.5 fL (ref 80.0–100.0)
Platelets: 150 10*3/uL (ref 150–400)
RBC: 3.18 MIL/uL — ABNORMAL LOW (ref 3.87–5.11)
RDW: 13.6 % (ref 11.5–15.5)
WBC: 5.2 10*3/uL (ref 4.0–10.5)
nRBC: 0 % (ref 0.0–0.2)

## 2020-04-03 LAB — COMPREHENSIVE METABOLIC PANEL
ALT: 136 U/L — ABNORMAL HIGH (ref 0–44)
AST: 177 U/L — ABNORMAL HIGH (ref 15–41)
Albumin: 2.8 g/dL — ABNORMAL LOW (ref 3.5–5.0)
Alkaline Phosphatase: 105 U/L (ref 38–126)
Anion gap: 11 (ref 5–15)
BUN: <5 mg/dL — ABNORMAL LOW (ref 6–20)
CO2: 20 mmol/L — ABNORMAL LOW (ref 22–32)
Calcium: 8 mg/dL — ABNORMAL LOW (ref 8.9–10.3)
Chloride: 106 mmol/L (ref 98–111)
Creatinine, Ser: 0.81 mg/dL (ref 0.44–1.00)
GFR, Estimated: >60 mL/min (ref >60)
Glucose, Bld: 75 mg/dL (ref 70–99)
Potassium: 3.5 mmol/L (ref 3.5–5.1)
Sodium: 137 mmol/L (ref 135–145)
Total Bilirubin: 0.6 mg/dL (ref 0.3–1.2)
Total Protein: 6.2 g/dL — ABNORMAL LOW (ref 6.5–8.1)

## 2020-04-03 LAB — C-REACTIVE PROTEIN: CRP: 5.1 mg/dL — ABNORMAL HIGH (ref <1.0)

## 2020-04-03 LAB — PROCALCITONIN: Procalcitonin: 0.37 ng/mL

## 2020-04-03 MED ORDER — SODIUM CHLORIDE 0.9 % IV SOLN: INTRAVENOUS | Status: DC

## 2020-04-03 MED ORDER — METHYLPREDNISOLONE SODIUM SUCC 125 MG IJ SOLR
40.0000 mg | Freq: Two times a day (BID) | INTRAMUSCULAR | Status: DC
  Administered 2020-04-04 – 2020-04-07 (×7): 40 mg via INTRAVENOUS
  Filled 2020-04-03 (×7): qty 2

## 2020-04-03 MED ORDER — SODIUM CHLORIDE 0.9 % IV BOLUS: 1000.0000 mL | Freq: Once | INTRAVENOUS | Status: DC

## 2020-04-03 MED ORDER — ACETAMINOPHEN 325 MG PO TABS
650.0000 mg | ORAL_TABLET | Freq: Four times a day (QID) | ORAL | Status: DC | PRN
  Administered 2020-04-03 – 2020-04-04 (×4): 650 mg via ORAL
  Filled 2020-04-03 (×4): qty 2

## 2020-04-03 MED ORDER — GUAIFENESIN-DM 100-10 MG/5ML PO SYRP
10.0000 mL | ORAL_SOLUTION | ORAL | Status: DC | PRN
  Administered 2020-04-03 – 2020-04-06 (×10): 10 mL via ORAL
  Filled 2020-04-03 (×10): qty 10

## 2020-04-03 MED ORDER — SODIUM CHLORIDE 0.9 % IV SOLN
200.0000 mg | Freq: Once | INTRAVENOUS | Status: AC
  Administered 2020-04-03: 200 mg via INTRAVENOUS
  Filled 2020-04-03: qty 40

## 2020-04-03 MED ORDER — LACTATED RINGERS IV BOLUS
1000.0000 mL | Freq: Once | INTRAVENOUS | Status: AC
  Administered 2020-04-03: 1000 mL via INTRAVENOUS

## 2020-04-03 MED ORDER — LACTATED RINGERS IV SOLN: INTRAVENOUS | Status: DC

## 2020-04-03 MED ORDER — ENOXAPARIN SODIUM 40 MG/0.4ML ~~LOC~~ SOLN
40.0000 mg | Freq: Two times a day (BID) | SUBCUTANEOUS | Status: DC
  Administered 2020-04-03 – 2020-04-07 (×8): 40 mg via SUBCUTANEOUS
  Filled 2020-04-03 (×8): qty 0.4

## 2020-04-03 MED ORDER — SODIUM CHLORIDE 0.9 % IV SOLN
100.0000 mg | Freq: Every day | INTRAVENOUS | Status: AC
  Administered 2020-04-04 – 2020-04-07 (×4): 100 mg via INTRAVENOUS
  Filled 2020-04-03 (×4): qty 20

## 2020-04-03 NOTE — ED Provider Notes (Signed)
  Physical Exam  Temp 99.6 F (37.6 C) (Oral)   Physical Exam  ED Course/Procedures     Procedures  Patient.  Known Covid disease for around the last week.  Pain in bilateral legs.  States she feels short of breath.  States she would have trouble walking from the car to here.  Felt as if a rib crack.  Also some diarrhea.  States has had chills.  Discussed with Dr. Adrian Blackwater from an EP will transfer to MAU for further treatment.       Benjiman Core, MD 04/03/20 1204

## 2020-04-03 NOTE — H&P (Addendum)
CONSULTATION   Theresa Christensen ENI:[id number] DOB: 01/25/1991 DOA: 04/03/2020  Referring MD/NP/PA: Dr.Stinson PCP:  Patient coming from: Home  Chief Complaint: cough, fever , dyspnea  HPI: Theresa Christensen is a 29 y.o. female, [redacted] weeks pregnant presented to the emergency room with cough fevers chills and some dyspnea. -Symptoms started with fever, body aches 7 days ago, she had a Covid test on 9/8 at [facility], this was positive. Continued to have worsening cough, myalgias, intermittent dyspnea, and leg pain which prompted her to come to the ER today. -She is not vaccinated for Covid  ED Course: Temp 102, O2 sats were ranging from 94-96% on room air, CXR noted Mild patchy bilateral airspace opacities, concerning for multifocal pneumonia. Labs noted mildly elevated LFTs  Review of Systems: As per HPI otherwise 14 point review of systems negative.   Past Medical History:  Diagnosis Date  . Anxiety   . Family history of adverse reaction to anesthesia    mom has nausea  . GERD (gastroesophageal reflux disease)   . Heart murmur    when younger told had a heart mummur no problems  . Migraines     Past Surgical History:  Procedure Laterality Date  . LAPAROSCOPY    . Procedure on Retina    . ROBOTIC ASSISTED LAPAROSCOPIC OVARIAN CYSTECTOMY N/A 11/25/2019   Procedure: XI ROBOTIC ASSISTED LAPAROSCOPIC UNILATERAL OVARIAN CYSTECTOMY/UNILATERALOOPORECTOMY;  Surgeon: Adolphus Birchwood, MD;  Location: [facility];  Service: Gynecology;  Laterality: N/A;  . WISDOM TOOTH EXTRACTION       reports that she has never smoked. She has never used smokeless tobacco. She reports previous alcohol use. She reports previous drug use.  Allergies  Allergen Reactions  . Penicillins Itching    Rash with minor swelling (CHILDHOOD REACTION)    Family History  Problem Relation Age of Onset  . Heart disease Other   . Cancer Father        lung  . Heart disease Maternal Grandmother   . Diabetes Maternal Grandfather    . Heart disease Maternal Grandfather   . Cancer Maternal Aunt        breast     Prior to Admission medications   Medication Sig Start Date End Date Taking? Authorizing Provider  acetaminophen (TYLENOL) 325 MG tablet Take 650 mg by mouth every 6 (six) hours as needed.   Yes [provider]  Prenatal Vit-Fe Fumarate-FA (PRENATAL PO) Take 1 tablet by mouth daily.   Yes [provider]    Physical Exam: Vitals:   04/03/20 1700 04/03/20 1705 04/03/20 1710 04/03/20 1715  BP:      Pulse:      Resp:      Temp:      TempSrc:      SpO2: 95% 96% 96% 96%      Constitutional: AAOx3, no distress Vitals:   04/03/20 1700 04/03/20 1705 04/03/20 1710 04/03/20 1715  BP:      Pulse:      Resp:      Temp:      TempSrc:      SpO2: 95% 96% 96% 96%   HEENT: no icterus Respiratory: distant breath sounds Cardiovascular: S1S2/RRR Abdomen: soft, non tender, Bowel sounds positive, gravid uterus Musculoskeletal: No joint deformity upper and lower extremities. Ext: no edema Skin: no rashes on exposed skin Neurologic: moves all extremities no localising signs Psychiatric: Normal judgment and insight. Alert and oriented x 3. Normal mood.   Labs on Admission: I have personally reviewed following  labs and imaging studies  CBC: Recent Labs  Lab 04/03/20 1459  WBC 5.2  HGB 10.4*  HCT 31.0*  MCV 97.5  PLT 150   Basic Metabolic Panel: Recent Labs  Lab 04/03/20 1459  NA 137  K 3.5  CL 106  CO2 20*  GLUCOSE 75  BUN <5*  CREATININE 0.81  CALCIUM 8.0*   GFR: CrCl cannot be calculated (Unknown ideal weight.). Liver Function Tests: Recent Labs  Lab 04/03/20 1459  AST 177*  ALT 136*  ALKPHOS 105  BILITOT 0.6  PROT 6.2*  ALBUMIN 2.8*   No results for input(s): LIPASE, AMYLASE in the last 168 hours. No results for input(s): AMMONIA in the last 168 hours. Coagulation Profile: No results for input(s): INR, PROTIME in the last 168 hours. Cardiac Enzymes: No  results for input(s): CKTOTAL, CKMB, CKMBINDEX, TROPONINI in the last 168 hours. BNP (last 3 results) No results for input(s): PROBNP in the last 8760 hours. HbA1C: No results for input(s): HGBA1C in the last 72 hours. CBG: No results for input(s): GLUCAP in the last 168 hours. Lipid Profile: No results for input(s): CHOL, HDL, LDLCALC, TRIG, CHOLHDL, LDLDIRECT in the last 72 hours. Thyroid Function Tests: No results for input(s): TSH, T4TOTAL, FREET4, T3FREE, THYROIDAB in the last 72 hours. Anemia Panel: No results for input(s): VITAMINB12, FOLATE, FERRITIN, TIBC, IRON, RETICCTPCT in the last 72 hours. Urine analysis:    Component Value Date/Time   COLORURINE AMBER (A) 11/25/2019 0537   APPEARANCEUR CLOUDY (A) 11/25/2019 0537   LABSPEC 1.027 11/25/2019 0537   PHURINE 5.0 11/25/2019 0537   GLUCOSEU NEGATIVE 11/25/2019 0537   HGBUR SMALL (A) 11/25/2019 0537   BILIRUBINUR NEGATIVE 11/25/2019 0537   KETONESUR 20 (A) 11/25/2019 0537   PROTEINUR 30 (A) 11/25/2019 0537   UROBILINOGEN 0.2 04/06/2011 0147   NITRITE NEGATIVE 11/25/2019 0537   LEUKOCYTESUR MODERATE (A) 11/25/2019 0537   Sepsis Labs: @LABRCNTIP (procalcitonin:4,lacticidven:4) )No results found for this or any previous visit (from the past 240 hour(s)).   Radiological Exams on Admission: DG Chest 2 View  Result Date: 04/03/2020 CLINICAL DATA:  Rib pain, COVID. EXAM: CHEST - 2 VIEW COMPARISON:  None. FINDINGS: Cardiac silhouette is mildly accentuated by low lung volumes. Mild patchy opacities in the right mid lung and bilateral lung bases. No visible pleural effusions or pneumothorax. No acute osseous abnormality. IMPRESSION: Mild patchy bilateral airspace opacities, concerning for multifocal pneumonia and compatible with reported diagnosis of COVID. Electronically Signed   By: 04/05/2020 MD   On: 04/03/2020 13:11    Assessment/Plan    Pneumonia due to COVID-19 virus  -start IV Remdesivir and IV solumedrol   -lovenox 40mg  Q12  -monitor LFTS closely  -monitor CBGS while on steroids  -monitor CRP, ferritin, d dimer -pharmacy to review all meds, gentle IVF for 1day  Mildly elevated LFTS -could be due to Covid, BP normal, do not suspect pre-ecclampsia -monitor closely while on Remdesivir   Leg pains -could be from myalgias and gravid uterus -check dopplers to r/o DVT  33week pregnancy -per OB team   Thank you for this consult, will follow along  DVT prophylaxis: lovenox Code Status: Full code   04/05/2020 MD [facility]   04/03/2020, 5:25 PM

## 2020-04-03 NOTE — MAU Note (Signed)
Pt got Covid test at [facility]. Confirmed results on patient's  phone.

## 2020-04-03 NOTE — H&P (Signed)
Chief Complaint  Patient presents with  . Back Pain    pt reporting rib pain   HPI This is a 29yo G1P0 at 33wks who presents with cough, SOB, DOE after being diagnosed with COVID 6 days ago. Cough is nonproductive. Takes robitussin and tylenol 650mg  every 4-6 hours. Fever improves with tylenol. Starting to get DOE with ambulation. She is not vaccinated and did not get MABs.          OB History    Gravida  1   Para      Term      Preterm      AB      Living        SAB      TAB      Ectopic      Multiple      Live Births                  Past Medical History:  Diagnosis Date  . Anxiety   . Family history of adverse reaction to anesthesia    mom has nausea  . GERD (gastroesophageal reflux disease)   . Heart murmur    when younger told had a heart mummur no problems  . Migraines          Past Surgical History:  Procedure Laterality Date  . LAPAROSCOPY    . Procedure on Retina    . ROBOTIC ASSISTED LAPAROSCOPIC OVARIAN CYSTECTOMY N/A 11/25/2019   Procedure: XI ROBOTIC ASSISTED LAPAROSCOPIC UNILATERAL OVARIAN CYSTECTOMY/UNILATERALOOPORECTOMY;  Surgeon: 01/26/2020, MD;  Location: [facility];  Service: Gynecology;  Laterality: N/A;  . WISDOM TOOTH EXTRACTION           Family History  Problem Relation Age of Onset  . Heart disease Other   . Cancer Father        lung  . Heart disease Maternal Grandmother   . Diabetes Maternal Grandfather   . Heart disease Maternal Grandfather   . Cancer Maternal Aunt        breast    Social History        Tobacco Use  . Smoking status: Never Smoker  . Smokeless tobacco: Never Used  Vaping Use  . Vaping Use: Never used  Substance Use Topics  . Alcohol use: Not Currently    Alcohol/week: 0.0 standard drinks    Comment: occassional  . Drug use: Not Currently    Allergies:  Allergies  Allergen Reactions  . Penicillins Itching    Rash with minor  swelling (CHILDHOOD REACTION)           Medications Prior to Admission  Medication Sig Dispense Refill Last Dose  . acetaminophen (TYLENOL) 325 MG tablet Take 650 mg by mouth every 6 (six) hours as needed.     . Prenatal Vit-Fe Fumarate-FA (PRENATAL PO) Take 1 tablet by mouth daily.   04/02/2020 at Unknown time    Review of Systems Physical Exam   Blood pressure 110/70, pulse 100, temperature 100.1 F (37.8 C), temperature source Oral, resp. rate 20, SpO2 100 %.  Physical Exam Vitals reviewed.  Constitutional:      Appearance: Normal appearance.  HENT:     Head: Normocephalic and atraumatic.  Cardiovascular:     Rate and Rhythm: Normal rate and regular rhythm.     Pulses: Normal pulses.  Pulmonary:     Effort: Pulmonary effort is normal. No respiratory distress.     Breath  sounds: Rales present. No wheezing.  Abdominal:     General: Abdomen is flat. There is no distension.     Palpations: Abdomen is soft. There is no mass.     Tenderness: There is no abdominal tenderness. There is no guarding or rebound.     Hernia: No hernia is present.  Neurological:     Mental Status: She is alert.  Psychiatric:        Mood and Affect: Mood normal.        Behavior: Behavior normal.        Thought Content: Thought content normal.        Judgment: Judgment normal.    Lab Results Last 24 Hours       Results for orders placed or performed during the hospital encounter of 04/03/20 (from the past 24 hour(s))  CBC     Status: Abnormal   Collection Time: 04/03/20  2:59 PM  Result Value Ref Range   WBC 5.2 4.0 - 10.5 K/uL   RBC 3.18 (L) 3.87 - 5.11 MIL/uL   Hemoglobin 10.4 (L) 12.0 - 15.0 g/dL   HCT 37.9 (L) 36 - 46 %   MCV 97.5 80.0 - 100.0 fL   MCH 32.7 26.0 - 34.0 pg   MCHC 33.5 30.0 - 36.0 g/dL   RDW 02.4 09.7 - 35.3 %   Platelets 150 150 - 400 K/uL   nRBC 0.0 0.0 - 0.2 %  Comprehensive metabolic panel     Status: Abnormal   Collection Time: 04/03/20   2:59 PM  Result Value Ref Range   Sodium 137 135 - 145 mmol/L   Potassium 3.5 3.5 - 5.1 mmol/L   Chloride 106 98 - 111 mmol/L   CO2 20 (L) 22 - 32 mmol/L   Glucose, Bld 75 70 - 99 mg/dL   BUN <5 (L) 6 - 20 mg/dL   Creatinine, Ser 2.99 0.44 - 1.00 mg/dL   Calcium 8.0 (L) 8.9 - 10.3 mg/dL   Total Protein 6.2 (L) 6.5 - 8.1 g/dL   Albumin 2.8 (L) 3.5 - 5.0 g/dL   AST 242 (H) 15 - 41 U/L   ALT 136 (H) 0 - 44 U/L   Alkaline Phosphatase 105 38 - 126 U/L   Total Bilirubin 0.6 0.3 - 1.2 mg/dL   GFR, Estimated >68 >34 mL/min   Anion gap 11 5 - 15  C-reactive protein     Status: Abnormal   Collection Time: 04/03/20  2:59 PM  Result Value Ref Range   CRP 5.1 (H) <1.0 mg/dL     DG Chest 2 View  Result Date: 04/03/2020 CLINICAL DATA:  Rib pain, COVID. EXAM: CHEST - 2 VIEW COMPARISON:  None. FINDINGS: Cardiac silhouette is mildly accentuated by low lung volumes. Mild patchy opacities in the right mid lung and bilateral lung bases. No visible pleural effusions or pneumothorax. No acute osseous abnormality. IMPRESSION: Mild patchy bilateral airspace opacities, concerning for multifocal pneumonia and compatible with reported diagnosis of COVID. Electronically Signed   By: Feliberto Harts MD   On: 04/03/2020 13:11     MAU Course  Procedures NST: initially, baseline in the 160-170s. Improved with fluid resusitation and is currently 150s. Moderate variability, accelerations. No decels.  MDM Has elevated LFTs, likely due to COVID.   Assessment and Plan     ICD-10-CM   1. Pneumonia due to COVID-19 virus  U07.1    J12.82   2. Elevated LFTs  R79.89 US Abdomen Limited RUQ (  LIVER/GB)    US Abdomen Limited RUQ (LIVER/GB)  3. [redacted] weeks gestation of pregnancy  Z3A.33    Start remdesivir. Check RUQ [place] Patient discussed with Dr Rana Snare - will admit to Dr Rana Snare. Consulted Dr Zannie Cove of Hospitalist service - they will consult.  Levie Heritage 04/03/2020,  2:44 PM    I spoke with Dr Adrian Blackwater re: Ms. Klinker and agreed she needs hospitalization and appreciate consultation and appreciate the medical management of her covid pneumonia I Discussed the medical and obstetrical concerns with Peja and the plan of care going forward.  At this time, her baby shows reassuring EFM with FHR reactive and only mild uterine irritability.  The RUQ [place] was normal. We will continue to manage the obstetric care and are available for any concerns DL

## 2020-04-03 NOTE — ED Triage Notes (Addendum)
Pt reporting rib pain, left side ribs hurt more. Pt describes pain as feeling like they may crack. Pt reports pain increases when coughing. Pt was diagnosed with Covid Tuesday 11/9.  Pt reports having diarrhea last night. Pt reports a fever of 100.5 last night.

## 2020-04-03 NOTE — MAU Provider Note (Addendum)
History     CSN: [id number]  Arrival date and time: 04/03/20 1136   First Provider Initiated Contact with Patient 04/03/20 1444        Chief Complaint  Patient presents with  . Back Pain    pt reporting rib pain   HPI This is a 29yo G1P0 at 33wks who presents with cough, SOB, DOE after being diagnosed with COVID 6 days ago. Cough is nonproductive. Takes robitussin and tylenol 650mg  every 4-6 hours. Fever improves with tylenol. Starting to get DOE with ambulation. She is not vaccinated and did not get MABs.  OB History    Gravida  1   Para      Term      Preterm      AB      Living        SAB      TAB      Ectopic      Multiple      Live Births              Past Medical History:  Diagnosis Date  . Anxiety   . Family history of adverse reaction to anesthesia    mom has nausea  . GERD (gastroesophageal reflux disease)   . Heart murmur    when younger told had a heart mummur no problems  . Migraines     Past Surgical History:  Procedure Laterality Date  . LAPAROSCOPY    . Procedure on Retina    . ROBOTIC ASSISTED LAPAROSCOPIC OVARIAN CYSTECTOMY N/A 11/25/2019   Procedure: XI ROBOTIC ASSISTED LAPAROSCOPIC UNILATERAL OVARIAN CYSTECTOMY/UNILATERALOOPORECTOMY;  Surgeon: 01/26/2020, MD;  Location: [facility];  Service: Gynecology;  Laterality: N/A;  . WISDOM TOOTH EXTRACTION      Family History  Problem Relation Age of Onset  . Heart disease Other   . Cancer Father        lung  . Heart disease Maternal Grandmother   . Diabetes Maternal Grandfather   . Heart disease Maternal Grandfather   . Cancer Maternal Aunt        breast    Social History   Tobacco Use  . Smoking status: Never Smoker  . Smokeless tobacco: Never Used  Vaping Use  . Vaping Use: Never used  Substance Use Topics  . Alcohol use: Not Currently    Alcohol/week: 0.0 standard drinks    Comment: occassional  . Drug use: Not Currently    Allergies:  Allergies  Allergen  Reactions  . Penicillins Itching    Rash with minor swelling (CHILDHOOD REACTION)    Medications Prior to Admission  Medication Sig Dispense Refill Last Dose  . acetaminophen (TYLENOL) 325 MG tablet Take 650 mg by mouth every 6 (six) hours as needed.     . Prenatal Vit-Fe Fumarate-FA (PRENATAL PO) Take 1 tablet by mouth daily.   04/02/2020 at Unknown time    Review of Systems Physical Exam   Blood pressure 110/70, pulse 100, temperature 100.1 F (37.8 C), temperature source Oral, resp. rate 20, SpO2 100 %.  Physical Exam Vitals reviewed.  Constitutional:      Appearance: Normal appearance.  HENT:     Head: Normocephalic and atraumatic.  Cardiovascular:     Rate and Rhythm: Normal rate and regular rhythm.     Pulses: Normal pulses.  Pulmonary:     Effort: Pulmonary effort is normal. No respiratory distress.     Breath sounds: Rales present. No wheezing.  Abdominal:  General: Abdomen is flat. There is no distension.     Palpations: Abdomen is soft. There is no mass.     Tenderness: There is no abdominal tenderness. There is no guarding or rebound.     Hernia: No hernia is present.  Neurological:     Mental Status: She is alert.  Psychiatric:        Mood and Affect: Mood normal.        Behavior: Behavior normal.        Thought Content: Thought content normal.        Judgment: Judgment normal.    Results for orders placed or performed during the hospital encounter of 04/03/20 (from the past 24 hour(s))  CBC     Status: Abnormal   Collection Time: 04/03/20  2:59 PM  Result Value Ref Range   WBC 5.2 4.0 - 10.5 K/uL   RBC 3.18 (L) 3.87 - 5.11 MIL/uL   Hemoglobin 10.4 (L) 12.0 - 15.0 g/dL   HCT 23.5 (L) 36 - 46 %   MCV 97.5 80.0 - 100.0 fL   MCH 32.7 26.0 - 34.0 pg   MCHC 33.5 30.0 - 36.0 g/dL   RDW 57.3 22.0 - 25.4 %   Platelets 150 150 - 400 K/uL   nRBC 0.0 0.0 - 0.2 %  Comprehensive metabolic panel     Status: Abnormal   Collection Time: 04/03/20  2:59 PM   Result Value Ref Range   Sodium 137 135 - 145 mmol/L   Potassium 3.5 3.5 - 5.1 mmol/L   Chloride 106 98 - 111 mmol/L   CO2 20 (L) 22 - 32 mmol/L   Glucose, Bld 75 70 - 99 mg/dL   BUN <5 (L) 6 - 20 mg/dL   Creatinine, Ser 2.70 0.44 - 1.00 mg/dL   Calcium 8.0 (L) 8.9 - 10.3 mg/dL   Total Protein 6.2 (L) 6.5 - 8.1 g/dL   Albumin 2.8 (L) 3.5 - 5.0 g/dL   AST 623 (H) 15 - 41 U/L   ALT 136 (H) 0 - 44 U/L   Alkaline Phosphatase 105 38 - 126 U/L   Total Bilirubin 0.6 0.3 - 1.2 mg/dL   GFR, Estimated >76 >28 mL/min   Anion gap 11 5 - 15  C-reactive protein     Status: Abnormal   Collection Time: 04/03/20  2:59 PM  Result Value Ref Range   CRP 5.1 (H) <1.0 mg/dL   DG Chest 2 View  Result Date: 04/03/2020 CLINICAL DATA:  Rib pain, COVID. EXAM: CHEST - 2 VIEW COMPARISON:  None. FINDINGS: Cardiac silhouette is mildly accentuated by low lung volumes. Mild patchy opacities in the right mid lung and bilateral lung bases. No visible pleural effusions or pneumothorax. No acute osseous abnormality. IMPRESSION: Mild patchy bilateral airspace opacities, concerning for multifocal pneumonia and compatible with reported diagnosis of COVID. Electronically Signed   By: Feliberto Harts MD   On: 04/03/2020 13:11     MAU Course  Procedures NST: initially, baseline in the 160-170s. Improved with fluid resusitation and is currently 150s. Moderate variability, accelerations. No decels.  MDM Has elevated LFTs, likely due to COVID.   Assessment and Plan     ICD-10-CM   1. Pneumonia due to COVID-19 virus  U07.1    J12.82   2. Elevated LFTs  R79.89 US Abdomen Limited RUQ (LIVER/GB)    US Abdomen Limited RUQ (LIVER/GB)  3. [redacted] weeks gestation of pregnancy  Z3A.33    Start remdesivir. Check  RUQ [place] Patient discussed with Dr Rana Snare - will admit to Dr Rana Snare. Consulted Dr Zannie Cove of Hospitalist service - they will consult.  Levie Heritage 04/03/2020, 2:44 PM

## 2020-04-03 NOTE — MAU Note (Addendum)
Pt transferred from [facility] ED with cough, SOB.  Had + Covid test last Tuesday. Temp 100.1, pulse 93, BP 110//70, SPO2 100 on room air. Reports cough since last Monday, has chills, headache, body aches.    Pt reporting no cntx, denies LOF, VB, +FM.

## 2020-04-04 ENCOUNTER — Encounter (HOSPITAL_COMMUNITY): Payer: Self-pay | Admitting: Obstetrics and Gynecology

## 2020-04-04 ENCOUNTER — Inpatient Hospital Stay (HOSPITAL_COMMUNITY): Payer: Managed Care, Other (non HMO)

## 2020-04-04 DIAGNOSIS — M7989 Other specified soft tissue disorders: Secondary | ICD-10-CM

## 2020-04-04 DIAGNOSIS — R609 Edema, unspecified: Secondary | ICD-10-CM | POA: Diagnosis not present

## 2020-04-04 LAB — CBC WITH DIFFERENTIAL/PLATELET
Abs Immature Granulocytes: 0.07 10*3/uL (ref 0.00–0.07)
Basophils Absolute: 0 10*3/uL (ref 0.0–0.1)
Basophils Relative: 0 %
Eosinophils Absolute: 0 10*3/uL (ref 0.0–0.5)
Eosinophils Relative: 0 %
HCT: 27.8 % — ABNORMAL LOW (ref 36.0–46.0)
Hemoglobin: 9.4 g/dL — ABNORMAL LOW (ref 12.0–15.0)
Immature Granulocytes: 1 %
Lymphocytes Relative: 11 %
Lymphs Abs: 0.6 10*3/uL — ABNORMAL LOW (ref 0.7–4.0)
MCH: 31.9 pg (ref 26.0–34.0)
MCHC: 33.8 g/dL (ref 30.0–36.0)
MCV: 94.2 fL (ref 80.0–100.0)
Monocytes Absolute: 0.2 10*3/uL (ref 0.1–1.0)
Monocytes Relative: 4 %
Neutro Abs: 4.9 10*3/uL (ref 1.7–7.7)
Neutrophils Relative %: 84 %
Platelets: 124 10*3/uL — ABNORMAL LOW (ref 150–400)
RBC: 2.95 MIL/uL — ABNORMAL LOW (ref 3.87–5.11)
RDW: 13.7 % (ref 11.5–15.5)
WBC: 5.8 10*3/uL (ref 4.0–10.5)
nRBC: 0 % (ref 0.0–0.2)

## 2020-04-04 LAB — COMPREHENSIVE METABOLIC PANEL
ALT: 147 U/L — ABNORMAL HIGH (ref 0–44)
AST: 193 U/L — ABNORMAL HIGH (ref 15–41)
Albumin: 2.3 g/dL — ABNORMAL LOW (ref 3.5–5.0)
Alkaline Phosphatase: 91 U/L (ref 38–126)
Anion gap: 9 (ref 5–15)
BUN: 5 mg/dL — ABNORMAL LOW (ref 6–20)
CO2: 19 mmol/L — ABNORMAL LOW (ref 22–32)
Calcium: 8 mg/dL — ABNORMAL LOW (ref 8.9–10.3)
Chloride: 110 mmol/L (ref 98–111)
Creatinine, Ser: 0.71 mg/dL (ref 0.44–1.00)
GFR, Estimated: >60 mL/min (ref >60)
Glucose, Bld: 70 mg/dL (ref 70–99)
Potassium: 3.3 mmol/L — ABNORMAL LOW (ref 3.5–5.1)
Sodium: 138 mmol/L (ref 135–145)
Total Bilirubin: 0.6 mg/dL (ref 0.3–1.2)
Total Protein: 5.3 g/dL — ABNORMAL LOW (ref 6.5–8.1)

## 2020-04-04 LAB — PROTIME-INR
INR: 0.9 (ref 0.8–1.2)
Prothrombin Time: 11.8 seconds (ref 11.4–15.2)

## 2020-04-04 LAB — C-REACTIVE PROTEIN: CRP: 5.6 mg/dL — ABNORMAL HIGH (ref <1.0)

## 2020-04-04 LAB — GLUCOSE, CAPILLARY
Glucose-Capillary: 72 mg/dL (ref 70–99)
Glucose-Capillary: 128 mg/dL — ABNORMAL HIGH (ref 70–99)

## 2020-04-04 LAB — D-DIMER, QUANTITATIVE: D-Dimer, Quant: 1.37 ug/mL-FEU — ABNORMAL HIGH (ref 0.00–0.50)

## 2020-04-04 LAB — AMNISURE RUPTURE OF MEMBRANE (ROM) NOT AT ARMC: Amnisure ROM: NEGATIVE

## 2020-04-04 LAB — FERRITIN: Ferritin: 978 ng/mL — ABNORMAL HIGH (ref 11–307)

## 2020-04-04 LAB — MAGNESIUM: Magnesium: 1.8 mg/dL (ref 1.7–2.4)

## 2020-04-04 MED ORDER — SODIUM CHLORIDE 0.9 % IV SOLN: INTRAVENOUS | Status: AC

## 2020-04-04 MED ORDER — POTASSIUM CHLORIDE CRYS ER 20 MEQ PO TBCR
40.0000 meq | EXTENDED_RELEASE_TABLET | Freq: Once | ORAL | Status: AC
  Administered 2020-04-04: 40 meq via ORAL
  Filled 2020-04-04: qty 2

## 2020-04-04 NOTE — Progress Notes (Signed)
Patient reports continued fatigue but overall feeling improved.  Continues to have cough and diarrhea.  On RA.  Feeling active FM.  Denies CTX, VB or LOF.    Vitals:   04/04/20 1500 04/04/20 1518  BP:  111/78  Pulse:  84  Resp:  (!) 22  Temp:  99 F (37.2 C)  SpO2: 99% 99%   NST Category I  B/L LE Doppler study prelim report negative RUQ u/s wnl  CBC Latest Ref Rng & Units 04/04/2020 04/03/2020 11/17/2019  WBC 4.0 - 10.5 K/uL 5.8 5.2 8.3  Hemoglobin 12.0 - 15.0 g/dL 6.1(P) 10.4(L) 10.9(L)  Hematocrit 36 - 46 % 27.8(L) 31.0(L) 30.7(L)  Platelets 150 - 400 K/uL 124(L) 150 221   CMP Latest Ref Rng & Units 04/04/2020 04/03/2020 11/17/2019  Glucose 70 - 99 mg/dL 70 75 90  BUN 6 - 20 mg/dL 5(L) <5(K) 13  Creatinine 0.44 - 1.00 mg/dL 9.32 6.71 2.45  Sodium 135 - 145 mmol/L 138 137 136  Potassium 3.5 - 5.1 mmol/L 3.3(L) 3.5 4.0  Chloride 98 - 111 mmol/L 110 106 106  CO2 22 - 32 mmol/L 19(L) 20(L) 21(L)  Calcium 8.9 - 10.3 mg/dL 8.0(L) 8.0(L) 8.8(L)  Total Protein 6.5 - 8.1 g/dL 5.3(L) 6.2(L) -  Total Bilirubin 0.3 - 1.2 mg/dL 0.6 0.6 -  Alkaline Phos 38 - 126 U/L 91 105 -  AST 15 - 41 U/L 193(H) 177(H) -  ALT 0 - 44 U/L 147(H) 136(H) -   CBG (last 3)  Recent Labs    04/04/20 0828 04/04/20 1240  GLUCAP 72 128*    Gen: A&O x 3 Abd: soft, NT  28yo G1 at [redacted]w[redacted]d with COVID pneumonia -Appreciate hospitalist management -Remdesivir and solumedrol -PPx LMWH -Continue to monitor LFTs and PLT.  BPs wnl and no suspicion at this time for Pre-eclampsia/HELLP. -Fetal status reassuring -OB will continue to follow along  Mitchel Honour, DO

## 2020-04-04 NOTE — Progress Notes (Signed)
PROGRESS NOTE                                                                                                                                                                                                             Patient Demographics:    Theresa Christensen, is a 29 y.o. female, DOB - [birth year], NOB:[id number]  Outpatient Primary MD for the patient is Patient, No Pcp Per    LOS - 1  Admit date - 04/03/2020    Chief Complaint  Patient presents with  . Back Pain    pt reporting rib pain  . Cough       Brief Narrative (HPI from H&P)  Theresa Christensen is a 29 y.o. female, [redacted] weeks pregnant presented to the emergency room with cough fevers chills and some dyspnea.  Her symptoms had started about a week prior to ER visit, she was diagnosed with COVID-19 pneumonia and admitted to the [facility] service.  TRH service was consulted for further management     Subjective:    Theresa Christensen today has, No headache, No chest pain, No abdominal pain - No Nausea, No new weakness tingling or numbness, +ve Cough but no SOB at rest.   Assessment  & Plan :     1.   Acute Covid 19 Viral Pneumonitis - she is unfortunately unvaccinated, he has incurred COVID-19 pneumonia but so far evidence of mild parenchymal damage, she has been appropriately started on steroids and Remdesivir.  We will continue to monitor.  She is currently on room air.   If she develops any worsening hypoxia we will have low threshold of using Actemra.  Note patient clearly told that we do not have any data in pregnant patients and any fetal injuries that may occur, if Actemra is needed and used due to worsening pulmonary status of the patient then there is always potential risk to the fetus which can include fetal malformation, deformity and fetal demise, she is agreeable to using Actemra if needed accepts the risks.   Actemra/Baricitinib  off label use - patient was told  that if COVID-19 pneumonitis gets worse we might potentially use Actemra off label, patient denies any known history of active diverticulitis, tuberculosis or hepatitis, understands the risks and benefits and wants to proceed with Actemra treatment if required.  Kindly see the fetal injury  conversation above.   Encouraged the patient to sit up in chair in the daytime use I-S and flutter valve for pulmonary toiletry and then prone in bed when at night.  Will advance activity and titrate down oxygen as possible.   Recent Labs  Lab 04/03/20 1459 04/04/20 0623  WBC 5.2 5.8  HGB 10.4* 9.4*  HCT 31.0* 27.8*  PLT 150 124*  CRP 5.1* 5.6*  DDIMER  --  1.37*  PROCALCITON 0.37  --   AST 177* 193*  ALT 136* 147*  ALKPHOS 105 91  BILITOT 0.6 0.6  ALBUMIN 2.8* 2.3*    2.  33 weeks pregnancy.  Per primary team which is OB.   3.  Borderline D-dimer.  Due to combination of pregnancy and inflammation of Covid.  Check lower extremity venous duplex.  Continue prophylactic Lovenox for now.  4.  Mild asymptomatic transaminitis.  Due to combination of COVID-19 infection and Remdesivir use, trend.  Monitor synthetic function through INR and albumin.  Ultrasound stable.   Condition - Fair  Family Communication  : Mother Velna Hatchet [phone] on 04/04/2020, explained Actemra use if needed.  She also consents and she will inform the boyfriend as well.  Code Status :  Full  Consults  :  TRH consulting for OB service  Procedures  :    RUQ [place] - Non acute  Leg [place] -   PUD Prophylaxis : None  Disposition Plan  :    Status is: Inpatient   DVT Prophylaxis  :  Lovenox    Lab Results  Component Value Date   PLT 124 (L) 04/04/2020    Diet :  Diet Order            Diet regular Room service appropriate? Yes; Fluid consistency: Thin  Diet effective now                  Inpatient Medications  Scheduled Meds: . enoxaparin (LOVENOX) injection  40 mg Subcutaneous Q12H  . methylPREDNISolone  (SOLU-MEDROL) injection  40 mg Intravenous Q12H  . potassium chloride  40 mEq Oral Once   Continuous Infusions: . sodium chloride    . remdesivir 100 mg in NS 100 mL    . sodium chloride     PRN Meds:.acetaminophen, guaiFENesin-dextromethorphan  Antibiotics  :    Anti-infectives (From admission, onward)   Start     Dose/Rate Route Frequency Ordered Stop   04/04/20 1200  remdesivir 100 mg in sodium chloride 0.9 % 100 mL IVPB       "Followed by" Linked Group Details   100 mg 200 mL/hr over 30 Minutes Intravenous Daily 04/03/20 1659 04/08/20 1159   04/03/20 2000  remdesivir 200 mg in sodium chloride 0.9% 250 mL IVPB       "Followed by" Linked Group Details   200 mg 580 mL/hr over 30 Minutes Intravenous Once 04/03/20 1659 04/03/20 2105       Time Spent in minutes  30   Susa Raring M.D on 04/04/2020 at 8:22 AM  To page go to [email or web address] - password [facility]  [facility] -  Office  [phone]   See all Orders from today for further details    Objective:   Vitals:   04/03/20 1902 04/03/20 2042 04/03/20 2229 04/04/20 0445  BP: 107/70   109/75  Pulse: 87   79  Resp:  (!) 22  20  Temp: 100 F (37.8 C) 99.8 F (37.7 C) 99.7 F (37.6 C) 98.8 F (37.1  C)  TempSrc: Oral Oral Oral Oral  SpO2: 100% 100%  98%  Weight:      Height:        Wt Readings from Last 3 Encounters:  04/03/20 89.4 kg  12/21/19 82.5 kg  11/25/19 83.2 kg     Intake/Output Summary (Last 24 hours) at 04/04/2020 1610 Last data filed at 04/04/2020 0400 Gross per 24 hour  Intake 1773.79 ml  Output --  Net 1773.79 ml     Physical Exam  Awake Alert, No new F.N deficits, Normal affect [place].AT,PERRAL Supple Neck,No JVD, No cervical lymphadenopathy appriciated.  Symmetrical Chest wall movement, Good air movement bilaterally, CTAB RRR,No Gallops,Rubs or new Murmurs, No Parasternal Heave +ve B.Sounds, Abd Soft, No tenderness, No organomegaly appriciated, No rebound - guarding or  rigidity. No Cyanosis, Clubbing or edema, No new Rash or bruise       Data Review:    CBC Recent Labs  Lab 04/03/20 1459 04/04/20 0623  WBC 5.2 5.8  HGB 10.4* 9.4*  HCT 31.0* 27.8*  PLT 150 124*  MCV 97.5 94.2  MCH 32.7 31.9  MCHC 33.5 33.8  RDW 13.6 13.7  LYMPHSABS  --  0.6*  MONOABS  --  0.2  EOSABS  --  0.0  BASOSABS  --  0.0    Recent Labs  Lab 04/03/20 1459 04/04/20 0623  NA 137 138  K 3.5 3.3*  CL 106 110  CO2 20* 19*  GLUCOSE 75 70  BUN <5* 5*  CREATININE 0.81 0.71  CALCIUM 8.0* 8.0*  AST 177* 193*  ALT 136* 147*  ALKPHOS 105 91  BILITOT 0.6 0.6  ALBUMIN 2.8* 2.3*  CRP 5.1* 5.6*  DDIMER  --  1.37*  PROCALCITON 0.37  --     ------------------------------------------------------------------------------------------------------------------ No results for input(s): CHOL, HDL, LDLCALC, TRIG, CHOLHDL, LDLDIRECT in the last 72 hours.  Lab Results  Component Value Date   HGBA1C 4.70 12/30/2017   ------------------------------------------------------------------------------------------------------------------ No results for input(s): TSH, T4TOTAL, T3FREE, THYROIDAB in the last 72 hours.  Invalid input(s): FREET3  Cardiac Enzymes No results for input(s): CKMB, TROPONINI, MYOGLOBIN in the last 168 hours.  Invalid input(s): CK ------------------------------------------------------------------------------------------------------------------ No results found for: BNP  Micro Results No results found for this or any previous visit (from the past 240 hour(s)).  Radiology Reports DG Chest 2 View  Result Date: 04/03/2020 CLINICAL DATA:  Rib pain, COVID. EXAM: CHEST - 2 VIEW COMPARISON:  None. FINDINGS: Cardiac silhouette is mildly accentuated by low lung volumes. Mild patchy opacities in the right mid lung and bilateral lung bases. No visible pleural effusions or pneumothorax. No acute osseous abnormality. IMPRESSION: Mild patchy bilateral airspace  opacities, concerning for multifocal pneumonia and compatible with reported diagnosis of COVID. Electronically Signed   By: Feliberto Harts MD   On: 04/03/2020 13:11   US Abdomen Limited RUQ (LIVER/GB)  Result Date: 04/03/2020 CLINICAL DATA:  Elevated LFTs EXAM: ULTRASOUND ABDOMEN LIMITED RIGHT UPPER QUADRANT COMPARISON:  09/05/2011 FINDINGS: Gallbladder: No gallstones or wall thickening visualized. No sonographic Murphy sign noted by sonographer. Common bile duct: Diameter: Normal diameter, 3 mm Liver: No focal lesion identified. Within normal limits in parenchymal echogenicity. Portal vein is patent on color Doppler imaging with normal direction of blood flow towards the liver. Other: None. IMPRESSION: Unremarkable right upper quadrant ultrasound. Electronically Signed   By: Charlett Nose M.D.   On: 04/03/2020 19:11

## 2020-04-04 NOTE — Progress Notes (Signed)
Lower extremity venous has been completed.   Preliminary results in CV Proc.   Blanch Media 04/04/2020 11:04 AM

## 2020-04-04 NOTE — Progress Notes (Signed)
RN called to pt room. Pt stated she had a little fluid come out when she coughed. Pt had watery, tannish-color fluid on pad and stated she has not seen this before. Notified Dr. Langston Masker. Received verbal order to collect amnisure.

## 2020-04-05 LAB — CBC WITH DIFFERENTIAL/PLATELET
Abs Immature Granulocytes: 0.21 10*3/uL — ABNORMAL HIGH (ref 0.00–0.07)
Basophils Absolute: 0 10*3/uL (ref 0.0–0.1)
Basophils Relative: 0 %
Eosinophils Absolute: 0 10*3/uL (ref 0.0–0.5)
Eosinophils Relative: 0 %
HCT: 29.4 % — ABNORMAL LOW (ref 36.0–46.0)
Hemoglobin: 9.9 g/dL — ABNORMAL LOW (ref 12.0–15.0)
Immature Granulocytes: 3 %
Lymphocytes Relative: 14 %
Lymphs Abs: 0.9 10*3/uL (ref 0.7–4.0)
MCH: 32.6 pg (ref 26.0–34.0)
MCHC: 33.7 g/dL (ref 30.0–36.0)
MCV: 96.7 fL (ref 80.0–100.0)
Monocytes Absolute: 0.4 10*3/uL (ref 0.1–1.0)
Monocytes Relative: 6 %
Neutro Abs: 5 10*3/uL (ref 1.7–7.7)
Neutrophils Relative %: 77 %
Platelets: 142 10*3/uL — ABNORMAL LOW (ref 150–400)
RBC: 3.04 MIL/uL — ABNORMAL LOW (ref 3.87–5.11)
RDW: 13.8 % (ref 11.5–15.5)
WBC: 6.5 10*3/uL (ref 4.0–10.5)
nRBC: 0 % (ref 0.0–0.2)

## 2020-04-05 LAB — COMPREHENSIVE METABOLIC PANEL
ALT: 146 U/L — ABNORMAL HIGH (ref 0–44)
AST: 159 U/L — ABNORMAL HIGH (ref 15–41)
Albumin: 2.2 g/dL — ABNORMAL LOW (ref 3.5–5.0)
Alkaline Phosphatase: 103 U/L (ref 38–126)
Anion gap: 8 (ref 5–15)
BUN: 9 mg/dL (ref 6–20)
CO2: 18 mmol/L — ABNORMAL LOW (ref 22–32)
Calcium: 8.2 mg/dL — ABNORMAL LOW (ref 8.9–10.3)
Chloride: 113 mmol/L — ABNORMAL HIGH (ref 98–111)
Creatinine, Ser: 0.7 mg/dL (ref 0.44–1.00)
GFR, Estimated: >60 mL/min (ref >60)
Glucose, Bld: 93 mg/dL (ref 70–99)
Potassium: 3.8 mmol/L (ref 3.5–5.1)
Sodium: 139 mmol/L (ref 135–145)
Total Bilirubin: 0.5 mg/dL (ref 0.3–1.2)
Total Protein: 5.3 g/dL — ABNORMAL LOW (ref 6.5–8.1)

## 2020-04-05 LAB — D-DIMER, QUANTITATIVE: D-Dimer, Quant: 0.92 ug/mL-FEU — ABNORMAL HIGH (ref 0.00–0.50)

## 2020-04-05 LAB — C-REACTIVE PROTEIN: CRP: 5 mg/dL — ABNORMAL HIGH (ref <1.0)

## 2020-04-05 LAB — MAGNESIUM: Magnesium: 1.8 mg/dL (ref 1.7–2.4)

## 2020-04-05 LAB — GLUCOSE, CAPILLARY
Glucose-Capillary: 87 mg/dL (ref 70–99)
Glucose-Capillary: 116 mg/dL — ABNORMAL HIGH (ref 70–99)
Glucose-Capillary: 93 mg/dL (ref 70–99)

## 2020-04-05 LAB — PROTIME-INR
INR: 1 (ref 0.8–1.2)
Prothrombin Time: 12.4 seconds (ref 11.4–15.2)

## 2020-04-05 MED ORDER — PANTOPRAZOLE SODIUM 40 MG PO TBEC
40.0000 mg | DELAYED_RELEASE_TABLET | Freq: Every day | ORAL | Status: DC
  Administered 2020-04-05 – 2020-04-07 (×3): 40 mg via ORAL
  Filled 2020-04-05 (×3): qty 1

## 2020-04-05 NOTE — Progress Notes (Signed)
Antepartum Progress Note  Patient reports slight improvement in SOB, which is limited to dyspnea on exertion. She reports frequent coughing and noted LOF during coughing, unclear if urine.  Amnisure collected and negative.  On RA.  Feeling active FM.  Denies CTX, VB or LOF.    Vitals:   04/04/20 2253 04/05/20 0552  BP: 105/67 106/72  Pulse: 73 70  Resp: (!) 22 20  Temp: 98.9 F (37.2 C) 98.5 F (36.9 C)  SpO2: 97% 98%   NST Category I CXR: mild patchy bilateral airspace opacities, concerning for multifocal PNA and compatible with COVID diagnosis. RUQ u/s wnl  CBC Latest Ref Rng & Units 04/05/2020 04/04/2020 04/03/2020  WBC 4.0 - 10.5 K/uL 6.5 5.8 5.2  Hemoglobin 12.0 - 15.0 g/dL 7.6(E) 8.3(T) 10.4(L)  Hematocrit 36 - 46 % 29.4(L) 27.8(L) 31.0(L)  Platelets 150 - 400 K/uL 142(L) 124(L) 150   CMP Latest Ref Rng & Units 04/05/2020 04/04/2020 04/03/2020  Glucose 70 - 99 mg/dL 93 70 75  BUN 6 - 20 mg/dL 9 5(L) <5(V)  Creatinine 0.44 - 1.00 mg/dL 7.61 6.07 3.71  Sodium 135 - 145 mmol/L 139 138 137  Potassium 3.5 - 5.1 mmol/L 3.8 3.3(L) 3.5  Chloride 98 - 111 mmol/L 113(H) 110 106  CO2 22 - 32 mmol/L 18(L) 19(L) 20(L)  Calcium 8.9 - 10.3 mg/dL 8.2(L) 8.0(L) 8.0(L)  Total Protein 6.5 - 8.1 g/dL 5.3(L) 5.3(L) 6.2(L)  Total Bilirubin 0.3 - 1.2 mg/dL 0.5 0.6 0.6  Alkaline Phos 38 - 126 U/L 103 91 105  AST 15 - 41 U/L 159(H) 193(H) 177(H)  ALT 0 - 44 U/L 146(H) 147(H) 136(H)   CBG (last 3)  Recent Labs    04/04/20 0828 04/04/20 1240  GLUCAP 72 128*    Gen: A&O x 3 Abd: soft, NT  28yo G1 at [redacted]w[redacted]d with COVID pneumonia -Appreciate hospitalist management -Remdesivir and solumedrol -PPx LMWH -Continue to monitor LFTs and PLT.  Currently improving.  BPs wnl and no suspicion at this time for Pre-eclampsia/HELLP. -Fetal status reassuring -OB will continue to follow along  Nilda Simmer MD

## 2020-04-05 NOTE — Progress Notes (Signed)
PROGRESS NOTE                                                                                                                                                                                                             Patient Demographics:    Theresa Christensen, is a 29 y.o. female, DOB - Oct 03, 1990, DIY:[id number]  Outpatient Primary MD for the patient is Patient, No Pcp Per    LOS - 2  Admit date - 04/03/2020    Chief Complaint  Patient presents with   Back Pain    pt reporting rib pain   Cough       Brief Narrative (HPI from H&P)   Theresa Christensen is a 29 y.o. female, [redacted] weeks pregnant presented to the emergency room with cough fevers chills and some dyspnea.  Her symptoms had started about a week prior to ER visit, she was diagnosed with COVID-19 pneumonia and admitted to the [facility] service.  [facility] service was consulted for further management     Subjective:    Theresa Christensen today reports some initial dyspnea this morning with activity, but this has improved after her using incentive spirometer, she denies any chest pain .    Assessment  & Plan :    Acute Covid 19 Viral Pneumonitis  - she is unfortunately unvaccinated, he has incurred COVID-19 pneumonia but so far evidence of mild parenchymal damage, she has been appropriately started on steroids and Remdesivir.  We will continue to monitor.  She remains on room air, I have encouraged her to use incentive spirometer today.   If she develops any worsening hypoxia we will have low threshold of using Actemra.  Note patient clearly told that we do not have any data in pregnant patients and any fetal injuries that may occur, if Actemra is needed and used due to worsening pulmonary status of the patient then there is always potential risk to the fetus which can include fetal malformation, deformity and fetal demise, she is agreeable to using Actemra if needed accepts the  risks.   Actemra/Baricitinib  off label use - patient was told that if COVID-19 pneumonitis gets worse we might potentially use Actemra off label, patient denies any known history of active diverticulitis, tuberculosis or hepatitis, understands the risks and benefits and wants to proceed with Actemra treatment if required.  Kindly see the fetal injury conversation above.   Encouraged the patient to sit up in chair in the daytime use I-S and flutter valve for pulmonary toiletry and then prone in bed when at night.  Will advance activity and titrate down oxygen as possible.   Recent Labs  Lab 04/03/20 1459 04/04/20 0623 04/04/20 0846 04/05/20 0523  WBC 5.2 5.8  --  6.5  HGB 10.4* 9.4*  --  9.9*  HCT 31.0* 27.8*  --  29.4*  PLT 150 124*  --  142*  CRP 5.1* 5.6*  --  5.0*  DDIMER  --  1.37*  --  0.92*  PROCALCITON 0.37  --   --   --   AST 177* 193*  --  159*  ALT 136* 147*  --  146*  ALKPHOS 105 91  --  103  BILITOT 0.6 0.6  --  0.5  ALBUMIN 2.8* 2.3*  --  2.2*  INR  --   --  0.9 1.0    33 weeks pregnancy.  Per primary team which is OB.   Borderline D-dimer.  Due to combination of pregnancy and inflammation of Covid.  Venous Dopplers negative for DVT continue prophylactic Lovenox for now.  Mild asymptomatic transaminitis.  Due to combination of COVID-19 infection and Remdesivir use, trend.  Monitor synthetic function through INR and albumin.  Ultrasound stable.   Condition - Fair  Family Communication  : Mother Velna Hatchet [phone] on 04/04/2020, explained Actemra use if needed.    Code Status :  Full  Consults  :  [facility] consulting for OB service  Procedures  :    RUQ [place] - Non acute  Leg [place] -   PUD Prophylaxis : None  Disposition Plan  :    Status is: Inpatient   DVT Prophylaxis  :  Lovenox    Lab Results  Component Value Date   PLT 142 (L) 04/05/2020    Diet :  Diet Order            Diet regular Room service appropriate? Yes; Fluid consistency: Thin  Diet  effective now                  Inpatient Medications  Scheduled Meds:  enoxaparin (LOVENOX) injection  40 mg Subcutaneous Q12H   methylPREDNISolone (SOLU-MEDROL) injection  40 mg Intravenous Q12H   Continuous Infusions:  remdesivir 100 mg in NS 100 mL 100 mg (04/05/20 1225)   sodium chloride     PRN Meds:.acetaminophen, guaiFENesin-dextromethorphan  Antibiotics  :    Anti-infectives (From admission, onward)   Start     Dose/Rate Route Frequency Ordered Stop   04/04/20 1200  remdesivir 100 mg in sodium chloride 0.9 % 100 mL IVPB       "Followed by" Linked Group Details   100 mg 200 mL/hr over 30 Minutes Intravenous Daily 04/03/20 1659 04/08/20 1159   04/03/20 2000  remdesivir 200 mg in sodium chloride 0.9% 250 mL IVPB       "Followed by" Linked Group Details   200 mg 580 mL/hr over 30 Minutes Intravenous Once 04/03/20 1659 04/03/20 2105       Mliss Fritz Yuan Gann M.D on 04/05/2020 at 1:56 PM  To page go to [email or web address]   [facility] -  Office  [phone]   See all Orders from today for further details    Objective:   Vitals:   04/05/20 1100 04/05/20 1200 04/05/20 1219 04/05/20 1300  BP:   102/67  Pulse:   86   Resp:   20   Temp:   98.8 F (37.1 C)   TempSrc:   Oral   SpO2: 96% 96% 98% 96%  Weight:      Height:        Wt Readings from Last 3 Encounters:  04/03/20 89.4 kg  12/21/19 82.5 kg  11/25/19 83.2 kg     Intake/Output Summary (Last 24 hours) at 04/05/2020 1356 Last data filed at 04/04/2020 1500 Gross per 24 hour  Intake 376.5 ml  Output --  Net 376.5 ml     Physical Exam  Awake Alert, Oriented X 3, No new F.N deficits, Normal affect Symmetrical Chest wall movement, Good air movement bilaterally, CTAB RRR,No Gallops,Rubs or new Murmurs, No Parasternal Heave No Cyanosis, Clubbing or edema, No new Rash or bruise      Data Review:    CBC Recent Labs  Lab 04/03/20 1459 04/04/20 0623 04/05/20 0523  WBC 5.2 5.8 6.5   HGB 10.4* 9.4* 9.9*  HCT 31.0* 27.8* 29.4*  PLT 150 124* 142*  MCV 97.5 94.2 96.7  MCH 32.7 31.9 32.6  MCHC 33.5 33.8 33.7  RDW 13.6 13.7 13.8  LYMPHSABS  --  0.6* 0.9  MONOABS  --  0.2 0.4  EOSABS  --  0.0 0.0  BASOSABS  --  0.0 0.0    Recent Labs  Lab 04/03/20 1459 04/04/20 0623 04/04/20 0846 04/05/20 0523  NA 137 138  --  139  K 3.5 3.3*  --  3.8  CL 106 110  --  113*  CO2 20* 19*  --  18*  GLUCOSE 75 70  --  93  BUN <5* 5*  --  9  CREATININE 0.81 0.71  --  0.70  CALCIUM 8.0* 8.0*  --  8.2*  AST 177* 193*  --  159*  ALT 136* 147*  --  146*  ALKPHOS 105 91  --  103  BILITOT 0.6 0.6  --  0.5  ALBUMIN 2.8* 2.3*  --  2.2*  MG  --  1.8  --  1.8  CRP 5.1* 5.6*  --  5.0*  DDIMER  --  1.37*  --  0.92*  PROCALCITON 0.37  --   --   --   INR  --   --  0.9 1.0    ------------------------------------------------------------------------------------------------------------------ No results for input(s): CHOL, HDL, LDLCALC, TRIG, CHOLHDL, LDLDIRECT in the last 72 hours.  Lab Results  Component Value Date   HGBA1C 4.70 12/30/2017   ------------------------------------------------------------------------------------------------------------------ No results for input(s): TSH, T4TOTAL, T3FREE, THYROIDAB in the last 72 hours.  Invalid input(s): FREET3  Cardiac Enzymes No results for input(s): CKMB, TROPONINI, MYOGLOBIN in the last 168 hours.  Invalid input(s): CK ------------------------------------------------------------------------------------------------------------------ No results found for: BNP  Micro Results No results found for this or any previous visit (from the past 240 hour(s)).  Radiology Reports DG Chest 2 View  Result Date: 04/03/2020 CLINICAL DATA:  Rib pain, COVID. EXAM: CHEST - 2 VIEW COMPARISON:  None. FINDINGS: Cardiac silhouette is mildly accentuated by low lung volumes. Mild patchy opacities in the right mid lung and bilateral lung bases. No  visible pleural effusions or pneumothorax. No acute osseous abnormality. IMPRESSION: Mild patchy bilateral airspace opacities, concerning for multifocal pneumonia and compatible with reported diagnosis of COVID. Electronically Signed   By: Feliberto Harts MD   On: 04/03/2020 13:11   VAS [place] LOWER EXTREMITY VENOUS (DVT)  Result Date: 04/04/2020  Lower Venous DVT  Study Indications: Swelling, and Edema.  Comparison Study: no prior Performing Technologist: Blanch MediaMegan Riddle RVS  Examination Guidelines: A complete evaluation includes B-mode imaging, spectral Doppler, color Doppler, and power Doppler as needed of all accessible portions of each vessel. Bilateral testing is considered an integral part of a complete examination. Limited examinations for reoccurring indications may be performed as noted. The reflux portion of the exam is performed with the patient in reverse Trendelenburg.  +---------+---------------+---------+-----------+----------+--------------+  RIGHT     Compressibility Phasicity Spontaneity Properties Thrombus Aging  +---------+---------------+---------+-----------+----------+--------------+  CFV       Full            Yes       Yes                                    +---------+---------------+---------+-----------+----------+--------------+  SFJ       Full                                                             +---------+---------------+---------+-----------+----------+--------------+  FV Prox   Full                                                             +---------+---------------+---------+-----------+----------+--------------+  FV Mid    Full                                                             +---------+---------------+---------+-----------+----------+--------------+  FV Distal Full                                                             +---------+---------------+---------+-----------+----------+--------------+  PFV       Full                                                              +---------+---------------+---------+-----------+----------+--------------+  POP       Full            Yes       Yes                                    +---------+---------------+---------+-----------+----------+--------------+  PTV       Full                                                             +---------+---------------+---------+-----------+----------+--------------+  PERO      Full                                                             +---------+---------------+---------+-----------+----------+--------------+   +---------+---------------+---------+-----------+----------+--------------+  LEFT      Compressibility Phasicity Spontaneity Properties Thrombus Aging  +---------+---------------+---------+-----------+----------+--------------+  CFV       Full            Yes       Yes                                    +---------+---------------+---------+-----------+----------+--------------+  SFJ       Full                                                             +---------+---------------+---------+-----------+----------+--------------+  FV Prox   Full                                                             +---------+---------------+---------+-----------+----------+--------------+  FV Mid    Full                                                             +---------+---------------+---------+-----------+----------+--------------+  FV Distal Full                                                             +---------+---------------+---------+-----------+----------+--------------+  PFV       Full                                                             +---------+---------------+---------+-----------+----------+--------------+  POP       Full            Yes       Yes                                    +---------+---------------+---------+-----------+----------+--------------+  PTV       Full                                                              +---------+---------------+---------+-----------+----------+--------------+  PERO      Full                                                             +---------+---------------+---------+-----------+----------+--------------+     Summary: BILATERAL: - No evidence of deep vein thrombosis seen in the lower extremities, bilaterally. - No evidence of superficial venous thrombosis in the lower extremities, bilaterally. -   *See table(s) above for measurements and observations. Electronically signed by Gretta Began MD on 04/04/2020 at 4:37:33 PM.    Final    US Abdomen Limited RUQ (LIVER/GB)  Result Date: 04/03/2020 CLINICAL DATA:  Elevated LFTs EXAM: ULTRASOUND ABDOMEN LIMITED RIGHT UPPER QUADRANT COMPARISON:  09/05/2011 FINDINGS: Gallbladder: No gallstones or wall thickening visualized. No sonographic Murphy sign noted by sonographer. Common bile duct: Diameter: Normal diameter, 3 mm Liver: No focal lesion identified. Within normal limits in parenchymal echogenicity. Portal vein is patent on color Doppler imaging with normal direction of blood flow towards the liver. Other: None. IMPRESSION: Unremarkable right upper quadrant ultrasound. Electronically Signed   By: Charlett Nose M.D.   On: 04/03/2020 19:11

## 2020-04-06 LAB — CBC WITH DIFFERENTIAL/PLATELET
Abs Immature Granulocytes: 0.37 10*3/uL — ABNORMAL HIGH (ref 0.00–0.07)
Basophils Absolute: 0 10*3/uL (ref 0.0–0.1)
Basophils Relative: 0 %
Eosinophils Absolute: 0 10*3/uL (ref 0.0–0.5)
Eosinophils Relative: 0 %
HCT: 30.7 % — ABNORMAL LOW (ref 36.0–46.0)
Hemoglobin: 10.2 g/dL — ABNORMAL LOW (ref 12.0–15.0)
Immature Granulocytes: 4 %
Lymphocytes Relative: 13 %
Lymphs Abs: 1.1 10*3/uL (ref 0.7–4.0)
MCH: 32.5 pg (ref 26.0–34.0)
MCHC: 33.2 g/dL (ref 30.0–36.0)
MCV: 97.8 fL (ref 80.0–100.0)
Monocytes Absolute: 0.6 10*3/uL (ref 0.1–1.0)
Monocytes Relative: 6 %
Neutro Abs: 6.7 10*3/uL (ref 1.7–7.7)
Neutrophils Relative %: 77 %
Platelets: 199 10*3/uL (ref 150–400)
RBC: 3.14 MIL/uL — ABNORMAL LOW (ref 3.87–5.11)
RDW: 13.8 % (ref 11.5–15.5)
WBC: 8.7 10*3/uL (ref 4.0–10.5)
nRBC: 0.2 % (ref 0.0–0.2)

## 2020-04-06 LAB — COMPREHENSIVE METABOLIC PANEL
ALT: 119 U/L — ABNORMAL HIGH (ref 0–44)
AST: 119 U/L — ABNORMAL HIGH (ref 15–41)
Albumin: 2.2 g/dL — ABNORMAL LOW (ref 3.5–5.0)
Alkaline Phosphatase: 94 U/L (ref 38–126)
Anion gap: 9 (ref 5–15)
BUN: 11 mg/dL (ref 6–20)
CO2: 18 mmol/L — ABNORMAL LOW (ref 22–32)
Calcium: 8.2 mg/dL — ABNORMAL LOW (ref 8.9–10.3)
Chloride: 112 mmol/L — ABNORMAL HIGH (ref 98–111)
Creatinine, Ser: 0.64 mg/dL (ref 0.44–1.00)
GFR, Estimated: >60 mL/min (ref >60)
Glucose, Bld: 95 mg/dL (ref 70–99)
Potassium: 3.8 mmol/L (ref 3.5–5.1)
Sodium: 139 mmol/L (ref 135–145)
Total Bilirubin: 0.6 mg/dL (ref 0.3–1.2)
Total Protein: 5.2 g/dL — ABNORMAL LOW (ref 6.5–8.1)

## 2020-04-06 LAB — GLUCOSE, CAPILLARY
Glucose-Capillary: 94 mg/dL (ref 70–99)
Glucose-Capillary: 116 mg/dL — ABNORMAL HIGH (ref 70–99)

## 2020-04-06 LAB — PROTIME-INR
INR: 1 (ref 0.8–1.2)
Prothrombin Time: 12.4 seconds (ref 11.4–15.2)

## 2020-04-06 LAB — C-REACTIVE PROTEIN: CRP: 2.3 mg/dL — ABNORMAL HIGH (ref <1.0)

## 2020-04-06 LAB — D-DIMER, QUANTITATIVE: D-Dimer, Quant: 0.95 ug/mL-FEU — ABNORMAL HIGH (ref 0.00–0.50)

## 2020-04-06 LAB — MAGNESIUM: Magnesium: 1.6 mg/dL — ABNORMAL LOW (ref 1.7–2.4)

## 2020-04-06 NOTE — Progress Notes (Addendum)
PROGRESS NOTE                                                                                                                                                                                                             Patient Demographics:    Theresa Christensen, is a 29 y.o. female, DOB - Oct 27, 1990, JAS:[id number]  Outpatient Primary MD for the patient is Patient, No Pcp Per    LOS - 3  Admit date - 04/03/2020    Chief Complaint  Patient presents with  . Back Pain    pt reporting rib pain  . Cough       Brief Narrative (HPI from H&P)   Theresa Christensen is a 29 y.o. female, [redacted] weeks pregnant presented to the emergency room with cough fevers chills and some dyspnea.  Her symptoms had started about a week prior to ER visit, she was diagnosed with COVID-19 pneumonia and admitted to the [facility] service.  [facility] service was consulted for further management     Subjective:    Theresa Christensen today reports some dyspnea with activity, cough with incentive spirometer, she denies any fever, chills or chest pain.      Assessment  & Plan :    Acute Covid 19 Viral Pneumonitis  - she is unfortunately unvaccinated, he has incurred COVID-19 pneumonia but so far evidence of mild parenchymal damage. -Continue with IV steroids , this can be transitioned to oral Decadron on discharge . -Continue with IV remdesivir, she is to finish last dose tomorrow, and hopefully can be discharged after that , continue to monitor LFTs closely . -Again she was encouraged today to use incentive spirometer, to sit on the chair and to increase her activity .  If she develops any worsening hypoxia we will have low threshold of using Actemra.  Note patient clearly told that we do not have any data in pregnant patients and any fetal injuries that may occur, if Actemra is needed and used due to worsening pulmonary status of the patient then there is always potential  risk to the fetus which can include fetal malformation, deformity and fetal demise, she is agreeable to using Actemra if needed accepts the risks.   Actemra/Baricitinib  off label use - patient was told that if COVID-19 pneumonitis gets worse we might potentially use Actemra off label, patient  denies any known history of active diverticulitis, tuberculosis or hepatitis, understands the risks and benefits and wants to proceed with Actemra treatment if required.  Kindly see the fetal injury conversation above.   Encouraged the patient to sit up in chair in the daytime use I-S and flutter valve for pulmonary toiletry and then prone in bed when at night.  Will advance activity and titrate down oxygen as possible.   Recent Labs  Lab 04/03/20 1459 04/04/20 0623 04/04/20 0846 04/05/20 0523 04/06/20 0534  WBC 5.2 5.8  --  6.5 8.7  HGB 10.4* 9.4*  --  9.9* 10.2*  HCT 31.0* 27.8*  --  29.4* 30.7*  PLT 150 124*  --  142* 199  CRP 5.1* 5.6*  --  5.0* 2.3*  DDIMER  --  1.37*  --  0.92* 0.95*  PROCALCITON 0.37  --   --   --   --   AST 177* 193*  --  159* 119*  ALT 136* 147*  --  146* 119*  ALKPHOS 105 91  --  103 94  BILITOT 0.6 0.6  --  0.5 0.6  ALBUMIN 2.8* 2.3*  --  2.2* 2.2*  INR  --   --  0.9 1.0 1.0    33 weeks pregnancy.  Per primary team which is OB.   Borderline D-dimer.  Due to combination of pregnancy and inflammation of Covid.  Venous Dopplers negative for DVT continue prophylactic Lovenox for now.  Mild asymptomatic transaminitis.  Due to combination of COVID-19 infection and Remdesivir use, trend.  Monitor synthetic function through INR and albumin.  Ultrasound stable.   Condition - Fair  Family Communication  : Mother Velna Hatchet [phone] on 04/04/2020, explained Actemra use if needed.    Code Status :  Full  Consults  :  TRH consulting for OB service  Procedures  :    RUQ [place] - Non acute  Leg [place] -   PUD Prophylaxis : None  Disposition Plan  :    Status is:  Inpatient   DVT Prophylaxis  :  Lovenox    Lab Results  Component Value Date   PLT 199 04/06/2020    Diet :  Diet Order            Diet regular Room service appropriate? Yes; Fluid consistency: Thin  Diet effective now                  Inpatient Medications  Scheduled Meds: . enoxaparin (LOVENOX) injection  40 mg Subcutaneous Q12H  . methylPREDNISolone (SOLU-MEDROL) injection  40 mg Intravenous Q12H  . pantoprazole  40 mg Oral Daily   Continuous Infusions: . remdesivir 100 mg in NS 100 mL 100 mg (04/05/20 1225)  . sodium chloride     PRN Meds:.acetaminophen, guaiFENesin-dextromethorphan  Antibiotics  :    Anti-infectives (From admission, onward)   Start     Dose/Rate Route Frequency Ordered Stop   04/04/20 1200  remdesivir 100 mg in sodium chloride 0.9 % 100 mL IVPB       "Followed by" Linked Group Details   100 mg 200 mL/hr over 30 Minutes Intravenous Daily 04/03/20 1659 04/08/20 1159   04/03/20 2000  remdesivir 200 mg in sodium chloride 0.9% 250 mL IVPB       "Followed by" Linked Group Details   200 mg 580 mL/hr over 30 Minutes Intravenous Once 04/03/20 1659 04/03/20 2105       Mliss Fritz Reygan Heagle M.D on 04/06/2020 at 11:33 AM  To page go to [email or web address]   [facility] -  Office  [phone]   See all Orders from today for further details    Objective:   Vitals:   04/06/20 0400 04/06/20 0507 04/06/20 0600 04/06/20 0826  BP:  110/65  112/73  Pulse:  81  69  Resp:  (!) 22  20  Temp:  98.2 F (36.8 C)  98.1 F (36.7 C)  TempSrc:  Oral  Oral  SpO2: 94% 96% 95% 96%  Weight:      Height:        Wt Readings from Last 3 Encounters:  04/03/20 89.4 kg  12/21/19 82.5 kg  11/25/19 83.2 kg    No intake or output data in the 24 hours ending 04/06/20 1133   Physical Exam  Awake Alert, Oriented X 3, No new F.N deficits, Normal affect Symmetrical Chest wall movement, Good air movement bilaterally, CTAB RRR,No Gallops,Rubs or new Murmurs, No  Parasternal Heave Abdomen soft. No Cyanosis, Clubbing or edema, No new Rash or bruise      Data Review:    CBC Recent Labs  Lab 04/03/20 1459 04/04/20 0623 04/05/20 0523 04/06/20 0534  WBC 5.2 5.8 6.5 8.7  HGB 10.4* 9.4* 9.9* 10.2*  HCT 31.0* 27.8* 29.4* 30.7*  PLT 150 124* 142* 199  MCV 97.5 94.2 96.7 97.8  MCH 32.7 31.9 32.6 32.5  MCHC 33.5 33.8 33.7 33.2  RDW 13.6 13.7 13.8 13.8  LYMPHSABS  --  0.6* 0.9 1.1  MONOABS  --  0.2 0.4 0.6  EOSABS  --  0.0 0.0 0.0  BASOSABS  --  0.0 0.0 0.0    Recent Labs  Lab 04/03/20 1459 04/04/20 0623 04/04/20 0846 04/05/20 0523 04/06/20 0534  NA 137 138  --  139 139  K 3.5 3.3*  --  3.8 3.8  CL 106 110  --  113* 112*  CO2 20* 19*  --  18* 18*  GLUCOSE 75 70  --  93 95  BUN <5* 5*  --  9 11  CREATININE 0.81 0.71  --  0.70 0.64  CALCIUM 8.0* 8.0*  --  8.2* 8.2*  AST 177* 193*  --  159* 119*  ALT 136* 147*  --  146* 119*  ALKPHOS 105 91  --  103 94  BILITOT 0.6 0.6  --  0.5 0.6  ALBUMIN 2.8* 2.3*  --  2.2* 2.2*  MG  --  1.8  --  1.8 1.6*  CRP 5.1* 5.6*  --  5.0* 2.3*  DDIMER  --  1.37*  --  0.92* 0.95*  PROCALCITON 0.37  --   --   --   --   INR  --   --  0.9 1.0 1.0    ------------------------------------------------------------------------------------------------------------------ No results for input(s): CHOL, HDL, LDLCALC, TRIG, CHOLHDL, LDLDIRECT in the last 72 hours.  Lab Results  Component Value Date   HGBA1C 4.70 12/30/2017   ------------------------------------------------------------------------------------------------------------------ No results for input(s): TSH, T4TOTAL, T3FREE, THYROIDAB in the last 72 hours.  Invalid input(s): FREET3  Cardiac Enzymes No results for input(s): CKMB, TROPONINI, MYOGLOBIN in the last 168 hours.  Invalid input(s): CK ------------------------------------------------------------------------------------------------------------------ No results found for: BNP  Micro  Results No results found for this or any previous visit (from the past 240 hour(s)).  Radiology Reports DG Chest 2 View  Result Date: 04/03/2020 CLINICAL DATA:  Rib pain, COVID. EXAM: CHEST - 2 VIEW COMPARISON:  None. FINDINGS: Cardiac silhouette is mildly accentuated by low lung  volumes. Mild patchy opacities in the right mid lung and bilateral lung bases. No visible pleural effusions or pneumothorax. No acute osseous abnormality. IMPRESSION: Mild patchy bilateral airspace opacities, concerning for multifocal pneumonia and compatible with reported diagnosis of COVID. Electronically Signed   By: Feliberto HartsFrederick S Jones MD   On: 04/03/2020 13:11   VAS US LOWER EXTREMITY VENOUS (DVT)  Result Date: 04/04/2020  Lower Venous DVT Study Indications: Swelling, and Edema.  Comparison Study: no prior Performing Technologist: Blanch MediaMegan Riddle RVS  Examination Guidelines: A complete evaluation includes B-mode imaging, spectral Doppler, color Doppler, and power Doppler as needed of all accessible portions of each vessel. Bilateral testing is considered an integral part of a complete examination. Limited examinations for reoccurring indications may be performed as noted. The reflux portion of the exam is performed with the patient in reverse Trendelenburg.  +---------+---------------+---------+-----------+----------+--------------+ RIGHT    CompressibilityPhasicitySpontaneityPropertiesThrombus Aging +---------+---------------+---------+-----------+----------+--------------+ CFV      Full           Yes      Yes                                 +---------+---------------+---------+-----------+----------+--------------+ SFJ      Full                                                        +---------+---------------+---------+-----------+----------+--------------+ FV Prox  Full                                                        +---------+---------------+---------+-----------+----------+--------------+  FV Mid   Full                                                        +---------+---------------+---------+-----------+----------+--------------+ FV DistalFull                                                        +---------+---------------+---------+-----------+----------+--------------+ PFV      Full                                                        +---------+---------------+---------+-----------+----------+--------------+ POP      Full           Yes      Yes                                 +---------+---------------+---------+-----------+----------+--------------+ PTV      Full                                                        +---------+---------------+---------+-----------+----------+--------------+  PERO     Full                                                        +---------+---------------+---------+-----------+----------+--------------+   +---------+---------------+---------+-----------+----------+--------------+ LEFT     CompressibilityPhasicitySpontaneityPropertiesThrombus Aging +---------+---------------+---------+-----------+----------+--------------+ CFV      Full           Yes      Yes                                 +---------+---------------+---------+-----------+----------+--------------+ SFJ      Full                                                        +---------+---------------+---------+-----------+----------+--------------+ FV Prox  Full                                                        +---------+---------------+---------+-----------+----------+--------------+ FV Mid   Full                                                        +---------+---------------+---------+-----------+----------+--------------+ FV DistalFull                                                        +---------+---------------+---------+-----------+----------+--------------+ PFV      Full                                                         +---------+---------------+---------+-----------+----------+--------------+ POP      Full           Yes      Yes                                 +---------+---------------+---------+-----------+----------+--------------+ PTV      Full                                                        +---------+---------------+---------+-----------+----------+--------------+ PERO     Full                                                        +---------+---------------+---------+-----------+----------+--------------+  Summary: BILATERAL: - No evidence of deep vein thrombosis seen in the lower extremities, bilaterally. - No evidence of superficial venous thrombosis in the lower extremities, bilaterally. -   *See table(s) above for measurements and observations. Electronically signed by Gretta Began MD on 04/04/2020 at 4:37:33 PM.    Final    US Abdomen Limited RUQ (LIVER/GB)  Result Date: 04/03/2020 CLINICAL DATA:  Elevated LFTs EXAM: ULTRASOUND ABDOMEN LIMITED RIGHT UPPER QUADRANT COMPARISON:  09/05/2011 FINDINGS: Gallbladder: No gallstones or wall thickening visualized. No sonographic Murphy sign noted by sonographer. Common bile duct: Diameter: Normal diameter, 3 mm Liver: No focal lesion identified. Within normal limits in parenchymal echogenicity. Portal vein is patent on color Doppler imaging with normal direction of blood flow towards the liver. Other: None. IMPRESSION: Unremarkable right upper quadrant ultrasound. Electronically Signed   By: Charlett Nose M.D.   On: 04/03/2020 19:11

## 2020-04-06 NOTE — Progress Notes (Signed)
33 3/7  Has SOB when up to BR, especially at night. Last night called nurse who applied O2 briefly. She feels fine now. No bleeding or leaking.  Vitals:   04/06/20 0400 04/06/20 0507 04/06/20 0600 04/06/20 0826  BP:  110/65  112/73  Pulse:  81  69  Temp:  98.2 F (36.8 C)  98.1 F (36.7 C)  Resp:  (!) 22  20  Height:      Weight:      SpO2: 94% 96% 95% 96%  TempSrc:  Oral  Oral  BMI (Calculated):       NST reactive  Results for orders placed or performed during the hospital encounter of 04/03/20 (from the past 24 hour(s))  Glucose, capillary     Status: Abnormal   Collection Time: 04/05/20  1:18 PM  Result Value Ref Range   Glucose-Capillary 116 (H) 70 - 99 mg/dL  Glucose, capillary     Status: None   Collection Time: 04/05/20  6:24 PM  Result Value Ref Range   Glucose-Capillary 93 70 - 99 mg/dL  CBC with Differential/Platelet     Status: Abnormal   Collection Time: 04/06/20  5:34 AM  Result Value Ref Range   WBC 8.7 4.0 - 10.5 K/uL   RBC 3.14 (L) 3.87 - 5.11 MIL/uL   Hemoglobin 10.2 (L) 12.0 - 15.0 g/dL   HCT 67.5 (L) 36 - 46 %   MCV 97.8 80.0 - 100.0 fL   MCH 32.5 26.0 - 34.0 pg   MCHC 33.2 30.0 - 36.0 g/dL   RDW 91.6 38.4 - 66.5 %   Platelets 199 150 - 400 K/uL   nRBC 0.2 0.0 - 0.2 %   Neutrophils Relative % 77 %   Neutro Abs 6.7 1.7 - 7.7 K/uL   Lymphocytes Relative 13 %   Lymphs Abs 1.1 0.7 - 4.0 K/uL   Monocytes Relative 6 %   Monocytes Absolute 0.6 0.1 - 1.0 K/uL   Eosinophils Relative 0 %   Eosinophils Absolute 0.0 0.0 - 0.5 K/uL   Basophils Relative 0 %   Basophils Absolute 0.0 0.0 - 0.1 K/uL   Immature Granulocytes 4 %   Abs Immature Granulocytes 0.37 (H) 0.00 - 0.07 K/uL  Comprehensive metabolic panel     Status: Abnormal   Collection Time: 04/06/20  5:34 AM  Result Value Ref Range   Sodium 139 135 - 145 mmol/L   Potassium 3.8 3.5 - 5.1 mmol/L   Chloride 112 (H) 98 - 111 mmol/L   CO2 18 (L) 22 - 32 mmol/L   Glucose, Bld 95 70 - 99 mg/dL   BUN  11 6 - 20 mg/dL   Creatinine, Ser 9.93 0.44 - 1.00 mg/dL   Calcium 8.2 (L) 8.9 - 10.3 mg/dL   Total Protein 5.2 (L) 6.5 - 8.1 g/dL   Albumin 2.2 (L) 3.5 - 5.0 g/dL   AST 570 (H) 15 - 41 U/L   ALT 119 (H) 0 - 44 U/L   Alkaline Phosphatase 94 38 - 126 U/L   Total Bilirubin 0.6 0.3 - 1.2 mg/dL   GFR, Estimated >17 >79 mL/min   Anion gap 9 5 - 15  C-reactive protein     Status: Abnormal   Collection Time: 04/06/20  5:34 AM  Result Value Ref Range   CRP 2.3 (H) <1.0 mg/dL  D-dimer, quantitative (not at [facility])     Status: Abnormal   Collection Time: 04/06/20  5:34 AM  Result Value Ref  Range   D-Dimer, Quant 0.95 (H) 0.00 - 0.50 ug/mL-FEU  Magnesium     Status: Abnormal   Collection Time: 04/06/20  5:34 AM  Result Value Ref Range   Magnesium 1.6 (L) 1.7 - 2.4 mg/dL  Protime-INR     Status: None   Collection Time: 04/06/20  5:34 AM  Result Value Ref Range   Prothrombin Time 12.4 11.4 - 15.2 seconds   INR 1.0 0.8 - 1.2  Glucose, capillary     Status: None   Collection Time: 04/06/20  9:37 AM  Result Value Ref Range   Glucose-Capillary 94 70 - 99 mg/dL   Comment 1 Notify RN    Comment 2 Document in Chart     A/P: -33 3/7 weeks          Fetal well being>NST cat one         -COVID pneumonia          Remdesivir, solumedrol          PPx Lovenox        - LFTs lower today and platelets normal        - Appreciate management per hospitalist

## 2020-04-07 LAB — COMPREHENSIVE METABOLIC PANEL
ALT: 101 U/L — ABNORMAL HIGH (ref 0–44)
AST: 84 U/L — ABNORMAL HIGH (ref 15–41)
Albumin: 2.1 g/dL — ABNORMAL LOW (ref 3.5–5.0)
Alkaline Phosphatase: 108 U/L (ref 38–126)
Anion gap: 7 (ref 5–15)
BUN: 11 mg/dL (ref 6–20)
CO2: 21 mmol/L — ABNORMAL LOW (ref 22–32)
Calcium: 8.1 mg/dL — ABNORMAL LOW (ref 8.9–10.3)
Chloride: 113 mmol/L — ABNORMAL HIGH (ref 98–111)
Creatinine, Ser: 0.67 mg/dL (ref 0.44–1.00)
GFR, Estimated: >60 mL/min (ref >60)
Glucose, Bld: 93 mg/dL (ref 70–99)
Potassium: 3.9 mmol/L (ref 3.5–5.1)
Sodium: 141 mmol/L (ref 135–145)
Total Bilirubin: 0.4 mg/dL (ref 0.3–1.2)
Total Protein: 5.2 g/dL — ABNORMAL LOW (ref 6.5–8.1)

## 2020-04-07 LAB — CBC WITH DIFFERENTIAL/PLATELET
Abs Immature Granulocytes: 0.49 10*3/uL — ABNORMAL HIGH (ref 0.00–0.07)
Basophils Absolute: 0 10*3/uL (ref 0.0–0.1)
Basophils Relative: 0 %
Eosinophils Absolute: 0 10*3/uL (ref 0.0–0.5)
Eosinophils Relative: 0 %
HCT: 31 % — ABNORMAL LOW (ref 36.0–46.0)
Hemoglobin: 10.1 g/dL — ABNORMAL LOW (ref 12.0–15.0)
Immature Granulocytes: 5 %
Lymphocytes Relative: 13 %
Lymphs Abs: 1.3 10*3/uL (ref 0.7–4.0)
MCH: 31.6 pg (ref 26.0–34.0)
MCHC: 32.6 g/dL (ref 30.0–36.0)
MCV: 96.9 fL (ref 80.0–100.0)
Monocytes Absolute: 0.8 10*3/uL (ref 0.1–1.0)
Monocytes Relative: 8 %
Neutro Abs: 6.9 10*3/uL (ref 1.7–7.7)
Neutrophils Relative %: 74 %
Platelets: 222 10*3/uL (ref 150–400)
RBC: 3.2 MIL/uL — ABNORMAL LOW (ref 3.87–5.11)
RDW: 13.7 % (ref 11.5–15.5)
WBC: 9.4 10*3/uL (ref 4.0–10.5)
nRBC: 0.9 % — ABNORMAL HIGH (ref 0.0–0.2)

## 2020-04-07 LAB — MAGNESIUM: Magnesium: 1.8 mg/dL (ref 1.7–2.4)

## 2020-04-07 LAB — PROTIME-INR
INR: 0.9 (ref 0.8–1.2)
Prothrombin Time: 12.1 seconds (ref 11.4–15.2)

## 2020-04-07 LAB — C-REACTIVE PROTEIN: CRP: 1.6 mg/dL — ABNORMAL HIGH (ref <1.0)

## 2020-04-07 LAB — D-DIMER, QUANTITATIVE: D-Dimer, Quant: 1.21 ug/mL-FEU — ABNORMAL HIGH (ref 0.00–0.50)

## 2020-04-07 MED ORDER — DEXAMETHASONE 6 MG PO TABS: 6.0000 mg | ORAL_TABLET | Freq: Every day | ORAL | 0 refills | Status: DC

## 2020-04-07 MED ORDER — DEXAMETHASONE 6 MG PO TABS: 6.0000 mg | ORAL_TABLET | Freq: Every day | ORAL | 0 refills | Status: AC

## 2020-04-07 MED ORDER — GUAIFENESIN-DM 100-10 MG/5ML PO SYRP: 5.0000 mL | ORAL_SOLUTION | ORAL | 0 refills | Status: DC | PRN

## 2020-04-07 MED ORDER — PANTOPRAZOLE SODIUM 40 MG PO TBEC
40.0000 mg | DELAYED_RELEASE_TABLET | Freq: Every day | ORAL | 0 refills | Status: DC
Start: 2020-04-08 — End: 2020-05-07

## 2020-04-07 NOTE — Progress Notes (Signed)
PROGRESS NOTE                                                                                                                                                                                                             Patient Demographics:    Theresa Christensen, is a 29 y.o. female, DOB - [birth year], YQM:[id number]  Outpatient Primary MD for the patient is Patient, No Pcp Per    LOS - 4  Admit date - 04/03/2020    Chief Complaint  Patient presents with  . Back Pain    pt reporting rib pain  . Cough       Brief Narrative (HPI from H&P)   Theresa Christensen is a 29 y.o. female, [redacted] weeks pregnant presented to the emergency room with cough fevers chills and some dyspnea.  Her symptoms had started about a week prior to ER visit, she was diagnosed with COVID-19 pneumonia and admitted to the [facility] service.  TRH service was consulted for further management     Subjective:    Theresa Christensen today reports no further dyspnea with activity she has been compliant with incentive spirometer, denies any chest pain, fever or chills.    Assessment  & Plan :    Acute Covid 19 Viral Pneumonitis  - she is unfortunately unvaccinated, he has incurred COVID-19 pneumonia but so far evidence of mild parenchymal damage. -She was treated with IV Solu-Medrol and IV remdesivir during hospital stay, she was encouraged with incentive spirometry, she is currently with no oxygen requirement, saturating 98% on bedside monitor on room air, discussed with her and husband at bedside, she should be able to discharge today after receiving IV remdesivir, and to finish 5 days of 6 mg oral Decadron, and to stay on PPI for GI prophylaxis for another week. -Have encouraged her to take incentive spirometry and flutter valve with her, and get a chair to stay ambulatory, to monitor closely for any dyspnea, I have encouraged her and her husband to get pulse ox and to  monitor oxygen saturation, and to inform her PCP or come to ED if O2 sat less than 91 to 92%.  Recent Labs  Lab 04/03/20 1459 04/04/20 0623 04/04/20 0846 04/05/20 0523 04/06/20 0534 04/07/20 0554  WBC 5.2 5.8  --  6.5 8.7 9.4  HGB 10.4* 9.4*  --  9.9* 10.2* 10.1*  HCT 31.0* 27.8*  --  29.4* 30.7* 31.0*  PLT 150 124*  --  142* 199 222  CRP 5.1* 5.6*  --  5.0* 2.3* 1.6*  DDIMER  --  1.37*  --  0.92* 0.95* 1.21*  PROCALCITON 0.37  --   --   --   --   --   AST 177* 193*  --  159* 119* 84*  ALT 136* 147*  --  146* 119* 101*  ALKPHOS 105 91  --  103 94 108  BILITOT 0.6 0.6  --  0.5 0.6 0.4  ALBUMIN 2.8* 2.3*  --  2.2* 2.2* 2.1*  INR  --   --  0.9 1.0 1.0 0.9    33 weeks pregnancy.  Per primary team which is OB.   Borderline D-dimer.  Due to combination of pregnancy and inflammation of Covid.  Venous Dopplers negative for DVT was kept on Lovenox for DVT prophylaxis during hospital stay.  Mild asymptomatic transaminitis.  Due to combination of COVID-19 infection and Remdesivir use, it is trending down.  Ultrasound stable.   Condition - Fair  Family Communication  : Mother Theresa Christensen [phone] on 04/04/2020, explained Actemra use if needed.    Code Status :  Full  Consults  :  TRH consulting for OB service  Procedures  :    RUQ [place] - Non acute  Leg [place] -   PUD Prophylaxis : None  Disposition Plan  :    Status is: Inpatient   DVT Prophylaxis  :  Lovenox    Lab Results  Component Value Date   PLT 222 04/07/2020    Diet :  Diet Order            Diet regular Room service appropriate? Yes; Fluid consistency: Thin  Diet effective now                  Inpatient Medications  Scheduled Meds: . enoxaparin (LOVENOX) injection  40 mg Subcutaneous Q12H  . methylPREDNISolone (SOLU-MEDROL) injection  40 mg Intravenous Q12H  . pantoprazole  40 mg Oral Daily   Continuous Infusions: . remdesivir 100 mg in NS 100 mL 100 mg (04/06/20 1214)  . sodium chloride     PRN  Meds:.acetaminophen, guaiFENesin-dextromethorphan  Antibiotics  :    Anti-infectives (From admission, onward)   Start     Dose/Rate Route Frequency Ordered Stop   04/04/20 1200  remdesivir 100 mg in sodium chloride 0.9 % 100 mL IVPB       "Followed by" Linked Group Details   100 mg 200 mL/hr over 30 Minutes Intravenous Daily 04/03/20 1659 04/08/20 1159   04/03/20 2000  remdesivir 200 mg in sodium chloride 0.9% 250 mL IVPB       "Followed by" Linked Group Details   200 mg 580 mL/hr over 30 Minutes Intravenous Once 04/03/20 1659 04/03/20 2105       Mliss Fritz Albert Devaul M.D on 04/07/2020 at 9:30 AM  To page go to [email or web address]   [facility] -  Office  [phone]   See all Orders from today for further details    Objective:   Vitals:   04/07/20 0530 04/07/20 0535 04/07/20 0617 04/07/20 0823  BP:   112/80 114/79  Pulse:   61 62  Resp:   18 18  Temp:   98.2 F (36.8 C) 97.8 F (36.6 C)  TempSrc:   Oral Oral  SpO2: 96% 96% 97% 97%  Weight:  Height:        Wt Readings from Last 3 Encounters:  04/03/20 89.4 kg  12/21/19 82.5 kg  11/25/19 83.2 kg    No intake or output data in the 24 hours ending 04/07/20 0930   Physical Exam  Awake Alert, Oriented X 3, No new F.N deficits, Normal affect Symmetrical Chest wall movement, Good air movement bilaterally, CTAB RRR,No Gallops,Rubs or new Murmurs, No Parasternal Heave No Cyanosis, Clubbing or edema, No new Rash or bruise       Data Review:    CBC Recent Labs  Lab 04/03/20 1459 04/04/20 0623 04/05/20 0523 04/06/20 0534 04/07/20 0554  WBC 5.2 5.8 6.5 8.7 9.4  HGB 10.4* 9.4* 9.9* 10.2* 10.1*  HCT 31.0* 27.8* 29.4* 30.7* 31.0*  PLT 150 124* 142* 199 222  MCV 97.5 94.2 96.7 97.8 96.9  MCH 32.7 31.9 32.6 32.5 31.6  MCHC 33.5 33.8 33.7 33.2 32.6  RDW 13.6 13.7 13.8 13.8 13.7  LYMPHSABS  --  0.6* 0.9 1.1 1.3  MONOABS  --  0.2 0.4 0.6 0.8  EOSABS  --  0.0 0.0 0.0 0.0  BASOSABS  --  0.0 0.0 0.0 0.0     Recent Labs  Lab 04/03/20 1459 04/04/20 0623 04/04/20 0846 04/05/20 0523 04/06/20 0534 04/07/20 0554  NA 137 138  --  139 139 141  K 3.5 3.3*  --  3.8 3.8 3.9  CL 106 110  --  113* 112* 113*  CO2 20* 19*  --  18* 18* 21*  GLUCOSE 75 70  --  93 95 93  BUN <5* 5*  --  9 11 11   CREATININE 0.81 0.71  --  0.70 0.64 0.67  CALCIUM 8.0* 8.0*  --  8.2* 8.2* 8.1*  AST 177* 193*  --  159* 119* 84*  ALT 136* 147*  --  146* 119* 101*  ALKPHOS 105 91  --  103 94 108  BILITOT 0.6 0.6  --  0.5 0.6 0.4  ALBUMIN 2.8* 2.3*  --  2.2* 2.2* 2.1*  MG  --  1.8  --  1.8 1.6* 1.8  CRP 5.1* 5.6*  --  5.0* 2.3* 1.6*  DDIMER  --  1.37*  --  0.92* 0.95* 1.21*  PROCALCITON 0.37  --   --   --   --   --   INR  --   --  0.9 1.0 1.0 0.9    ------------------------------------------------------------------------------------------------------------------ No results for input(s): CHOL, HDL, LDLCALC, TRIG, CHOLHDL, LDLDIRECT in the last 72 hours.  Lab Results  Component Value Date   HGBA1C 4.70 12/30/2017   ------------------------------------------------------------------------------------------------------------------ No results for input(s): TSH, T4TOTAL, T3FREE, THYROIDAB in the last 72 hours.  Invalid input(s): FREET3  Cardiac Enzymes No results for input(s): CKMB, TROPONINI, MYOGLOBIN in the last 168 hours.  Invalid input(s): CK ------------------------------------------------------------------------------------------------------------------ No results found for: BNP  Micro Results No results found for this or any previous visit (from the past 240 hour(s)).  Radiology Reports DG Chest 2 View  Result Date: 04/03/2020 CLINICAL DATA:  Rib pain, COVID. EXAM: CHEST - 2 VIEW COMPARISON:  None. FINDINGS: Cardiac silhouette is mildly accentuated by low lung volumes. Mild patchy opacities in the right mid lung and bilateral lung bases. No visible pleural effusions or pneumothorax. No acute osseous  abnormality. IMPRESSION: Mild patchy bilateral airspace opacities, concerning for multifocal pneumonia and compatible with reported diagnosis of COVID. Electronically Signed   By: Feliberto HartsFrederick S Jones MD   On: 04/03/2020 13:11  VAS [place] LOWER EXTREMITY VENOUS (DVT)  Result Date: 04/04/2020  Lower Venous DVT Study Indications: Swelling, and Edema.  Comparison Study: no prior Performing Technologist: Blanch Media RVS  Examination Guidelines: A complete evaluation includes B-mode imaging, spectral Doppler, color Doppler, and power Doppler as needed of all accessible portions of each vessel. Bilateral testing is considered an integral part of a complete examination. Limited examinations for reoccurring indications may be performed as noted. The reflux portion of the exam is performed with the patient in reverse Trendelenburg.  +---------+---------------+---------+-----------+----------+--------------+ RIGHT    CompressibilityPhasicitySpontaneityPropertiesThrombus Aging +---------+---------------+---------+-----------+----------+--------------+ CFV      Full           Yes      Yes                                 +---------+---------------+---------+-----------+----------+--------------+ SFJ      Full                                                        +---------+---------------+---------+-----------+----------+--------------+ FV Prox  Full                                                        +---------+---------------+---------+-----------+----------+--------------+ FV Mid   Full                                                        +---------+---------------+---------+-----------+----------+--------------+ FV DistalFull                                                        +---------+---------------+---------+-----------+----------+--------------+ PFV      Full                                                         +---------+---------------+---------+-----------+----------+--------------+ POP      Full           Yes      Yes                                 +---------+---------------+---------+-----------+----------+--------------+ PTV      Full                                                        +---------+---------------+---------+-----------+----------+--------------+ PERO     Full                                                        +---------+---------------+---------+-----------+----------+--------------+   +---------+---------------+---------+-----------+----------+--------------+  LEFT     CompressibilityPhasicitySpontaneityPropertiesThrombus Aging +---------+---------------+---------+-----------+----------+--------------+ CFV      Full           Yes      Yes                                 +---------+---------------+---------+-----------+----------+--------------+ SFJ      Full                                                        +---------+---------------+---------+-----------+----------+--------------+ FV Prox  Full                                                        +---------+---------------+---------+-----------+----------+--------------+ FV Mid   Full                                                        +---------+---------------+---------+-----------+----------+--------------+ FV DistalFull                                                        +---------+---------------+---------+-----------+----------+--------------+ PFV      Full                                                        +---------+---------------+---------+-----------+----------+--------------+ POP      Full           Yes      Yes                                 +---------+---------------+---------+-----------+----------+--------------+ PTV      Full                                                         +---------+---------------+---------+-----------+----------+--------------+ PERO     Full                                                        +---------+---------------+---------+-----------+----------+--------------+     Summary: BILATERAL: - No evidence of deep vein thrombosis seen in the lower extremities, bilaterally. - No evidence of superficial venous thrombosis in the lower extremities, bilaterally. -   *See table(s) above for measurements and observations. Electronically signed by Gretta Began MD  on 04/04/2020 at 4:37:33 PM.    Final    US Abdomen Limited RUQ (LIVER/GB)  Result Date: 04/03/2020 CLINICAL DATA:  Elevated LFTs EXAM: ULTRASOUND ABDOMEN LIMITED RIGHT UPPER QUADRANT COMPARISON:  09/05/2011 FINDINGS: Gallbladder: No gallstones or wall thickening visualized. No sonographic Murphy sign noted by sonographer. Common bile duct: Diameter: Normal diameter, 3 mm Liver: No focal lesion identified. Within normal limits in parenchymal echogenicity. Portal vein is patent on color Doppler imaging with normal direction of blood flow towards the liver. Other: None. IMPRESSION: Unremarkable right upper quadrant ultrasound. Electronically Signed   By: Charlett Nose M.D.   On: 04/03/2020 19:11

## 2020-04-07 NOTE — Progress Notes (Signed)
Reviewed discharge antepartum instructions with patient and significant other regarding medications, when to call MD/go to [facility], and quarantine instructions. Patient to call today and scheduled next OB appointment. Patient verbalized understanding of discharge instructions and asked appropriate questions.

## 2020-04-07 NOTE — Discharge Instructions (Signed)
Person Under Monitoring Name: Theresa Christensen  Location: [address] [place] [address]   Infection Prevention Recommendations for Individuals Confirmed to have, or Being Evaluated for, 2019 Novel Coronavirus (COVID-19) Infection Who Receive Care at Home  Individuals who are confirmed to have, or are being evaluated for, COVID-19 should follow the prevention steps below until a healthcare provider or local or state health department says they can return to normal activities.  Stay home except to get medical care You should restrict activities outside your home, except for getting medical care. Do not go to work, school, or public areas, and do not use public transportation or taxis.  Call ahead before visiting your doctor Before your medical appointment, call the healthcare provider and tell them that you have, or are being evaluated for, COVID-19 infection. This will help the healthcare provider's office take steps to keep other people from getting infected. Ask your healthcare provider to call the local or state health department.  Monitor your symptoms Seek prompt medical attention if your illness is worsening (e.g., difficulty breathing). Before going to your medical appointment, call the healthcare provider and tell them that you have, or are being evaluated for, COVID-19 infection. Ask your healthcare provider to call the local or state health department.  Wear a facemask You should wear a facemask that covers your nose and mouth when you are in the same room with other people and when you visit a healthcare provider. People who live with or visit you should also wear a facemask while they are in the same room with you.  Separate yourself from other people in your home As much as possible, you should stay in a different room from other people in your home. Also, you should use a separate bathroom, if available.  Avoid sharing household items You should not share  dishes, drinking glasses, cups, eating utensils, towels, bedding, or other items with other people in your home. After using these items, you should wash them thoroughly with soap and water.  Cover your coughs and sneezes Cover your mouth and nose with a tissue when you cough or sneeze, or you can cough or sneeze into your sleeve. Throw used tissues in a lined trash can, and immediately wash your hands with soap and water for at least 20 seconds or use an alcohol-based hand rub.  Wash your [employer] your hands often and thoroughly with soap and water for at least 20 seconds. You can use an alcohol-based hand sanitizer if soap and water are not available and if your hands are not visibly dirty. Avoid touching your eyes, nose, and mouth with unwashed hands.   Prevention Steps for Caregivers and Household Members of Individuals Confirmed to have, or Being Evaluated for, COVID-19 Infection Being Cared for in the Home  If you live with, or provide care at home for, a person confirmed to have, or being evaluated for, COVID-19 infection please follow these guidelines to prevent infection:  Follow healthcare provider's instructions Make sure that you understand and can help the patient follow any healthcare provider instructions for all care.  Provide for the patient's basic needs You should help the patient with basic needs in the home and provide support for getting groceries, prescriptions, and other personal needs.  Monitor the patient's symptoms If they are getting sicker, call his or her medical provider and tell them that the patient has, or is being evaluated for, COVID-19 infection. This will help the healthcare provider's office  take steps to keep other people from getting infected. Ask the healthcare provider to call the local or state health department.  Limit the number of people who have contact with the patient  If possible, have only one caregiver for the patient.  Other  household members should stay in another home or place of residence. If this is not possible, they should stay  in another room, or be separated from the patient as much as possible. Use a separate bathroom, if available.  Restrict visitors who do not have an essential need to be in the home.  Keep older adults, very young children, and other sick people away from the patient Keep older adults, very young children, and those who have compromised immune systems or chronic health conditions away from the patient. This includes people with chronic heart, lung, or kidney conditions, diabetes, and cancer.  Ensure good ventilation Make sure that shared spaces in the home have good air flow, such as from an air conditioner or an opened window, weather permitting.  Wash your hands often  Wash your hands often and thoroughly with soap and water for at least 20 seconds. You can use an alcohol based hand sanitizer if soap and water are not available and if your hands are not visibly dirty.  Avoid touching your eyes, nose, and mouth with unwashed hands.  Use disposable paper towels to dry your hands. If not available, use dedicated cloth towels and replace them when they become wet.  Wear a facemask and gloves  Wear a disposable facemask at all times in the room and gloves when you touch or have contact with the patient's blood, body fluids, and/or secretions or excretions, such as sweat, saliva, sputum, nasal mucus, vomit, urine, or feces.  Ensure the mask fits over your nose and mouth tightly, and do not touch it during use.  Throw out disposable facemasks and gloves after using them. Do not reuse.  Wash your hands immediately after removing your facemask and gloves.  If your personal clothing becomes contaminated, carefully remove clothing and launder. Wash your hands after handling contaminated clothing.  Place all used disposable facemasks, gloves, and other waste in a lined container before  disposing them with other household waste.  Remove gloves and wash your hands immediately after handling these items.  Do not share dishes, glasses, or other household items with the patient  Avoid sharing household items. You should not share dishes, drinking glasses, cups, eating utensils, towels, bedding, or other items with a patient who is confirmed to have, or being evaluated for, COVID-19 infection.  After the person uses these items, you should wash them thoroughly with soap and water.  Wash laundry thoroughly  Immediately remove and wash clothes or bedding that have blood, body fluids, and/or secretions or excretions, such as sweat, saliva, sputum, nasal mucus, vomit, urine, or feces, on them.  Wear gloves when handling laundry from the patient.  Read and follow directions on labels of laundry or clothing items and detergent. In general, wash and dry with the warmest temperatures recommended on the label.  Clean all areas the individual has used often  Clean all touchable surfaces, such as counters, tabletops, doorknobs, bathroom fixtures, toilets, phones, keyboards, tablets, and bedside tables, every day. Also, clean any surfaces that may have blood, body fluids, and/or secretions or excretions on them.  Wear gloves when cleaning surfaces the patient has come in contact with.  Use a diluted bleach solution (e.g., dilute bleach with 1 part  bleach and 10 parts water) or a household disinfectant with a label that says EPA-registered for coronaviruses. To make a bleach solution at home, add 1 tablespoon of bleach to 1 quart (4 cups) of water. For a larger supply, add  cup of bleach to 1 gallon (16 cups) of water.  Read labels of cleaning products and follow recommendations provided on product labels. Labels contain instructions for safe and effective use of the cleaning product including precautions you should take when applying the product, such as wearing gloves or eye protection  and making sure you have good ventilation during use of the product.  Remove gloves and wash hands immediately after cleaning.  Monitor yourself for signs and symptoms of illness Caregivers and household members are considered close contacts, should monitor their health, and will be asked to limit movement outside of the home to the extent possible. Follow the monitoring steps for close contacts listed on the symptom monitoring form.   ? If you have additional questions, contact your local health department or call the epidemiologist on call at [phone] (available 24/7). ? This guidance is subject to change. For the most up-to-date guidance from [facility], please refer to their website: [email or web address]

## 2020-04-07 NOTE — Discharge Summary (Signed)
Physician Discharge Summary  Patient ID: Theresa Christensen MRN: [id number] DOB/AGE: November 08, 1990 28 y.o.  Admit date: 04/03/2020 Discharge date: 04/07/2020  Admission Diagnoses: COVID pneumonia in unvaccinated pregnancy  Discharge Diagnoses:  Active Problems:   Pneumonia due to COVID-19 virus   Discharged Condition: good  Hospital Course: 29 yo G1P0 unvaccinated patient presented at 28 wga with COVID pneumonia. She was managed by the Internal medicine team. She was given IV Solu-Medrol and IV remdesivir and deemed stable for discharge on HD#5 with outpatient management of COVID. Her pregnancy was managed by [place] and has been uncomplicated while in house. Fetal testing has been reassuring.  She had elevated LFTs likely s/s COVID. They have been downtrending and she has no other s/s PIH.   Consults: internal medicine  Significant Diagnostic Studies: labs:  CBC    Component Value Date/Time   WBC 9.4 04/07/2020 0554   RBC 3.20 (L) 04/07/2020 0554   HGB 10.1 (L) 04/07/2020 0554   HCT 31.0 (L) 04/07/2020 0554   PLT 222 04/07/2020 0554   MCV 96.9 04/07/2020 0554   MCV 90.5 09/03/2011 1844   MCH 31.6 04/07/2020 0554   MCHC 32.6 04/07/2020 0554   RDW 13.7 04/07/2020 0554   LYMPHSABS 1.3 04/07/2020 0554   MONOABS 0.8 04/07/2020 0554   EOSABS 0.0 04/07/2020 0554   BASOSABS 0.0 04/07/2020 0554   CMP     Component Value Date/Time   NA 141 04/07/2020 0554   K 3.9 04/07/2020 0554   CL 113 (H) 04/07/2020 0554   CO2 21 (L) 04/07/2020 0554   GLUCOSE 93 04/07/2020 0554   BUN 11 04/07/2020 0554   CREATININE 0.67 04/07/2020 0554   CALCIUM 8.1 (L) 04/07/2020 0554   PROT 5.2 (L) 04/07/2020 0554   ALBUMIN 2.1 (L) 04/07/2020 0554   AST 84 (H) 04/07/2020 0554   ALT 101 (H) 04/07/2020 0554   ALKPHOS 108 04/07/2020 0554   BILITOT 0.4 04/07/2020 0554   GFRNONAA >60 04/07/2020 0554   GFRAA >60 11/17/2019 1526      Treatments: IV hydration and medications  Discharge Exam: Blood pressure  114/79, pulse 62, temperature 97.8 F (36.6 C), temperature source Oral, resp. rate 18, height 5\' 5"  (1.651 m), weight 89.4 kg, SpO2 97 %. Awake Alert, Oriented X 3, No new F.N deficits, Normal affect Symmetrical Chest wall movement, Good air movement bilaterally, CTAB RRR,No Gallops,Rubs or new Murmurs, No Parasternal Heave No Cyanosis, Clubbing or edema, No new Rash or bruise    Disposition: Discharge disposition: 01-Home or Self Care       Discharge Instructions    Discharge activity:  No Restrictions   Complete by: As directed    Discharge diet:  No restrictions   Complete by: As directed    Fetal Kick Count:  Lie on our left side for one hour after a meal, and count the number of times your baby kicks.  If it is less than 5 times, get up, move around and drink some juice.  Repeat the test 30 minutes later.  If it is still less than 5 kicks in an hour, notify your doctor.   Complete by: As directed    Notify physician for a general feeling that "something is not right"   Complete by: As directed    Notify physician for increase or change in vaginal discharge   Complete by: As directed    Notify physician for intestinal cramps, with or without diarrhea, sometimes described as "gas pain"   Complete by:  As directed    Notify physician for leaking of fluid   Complete by: As directed    Notify physician for low, dull backache, unrelieved by heat or Tylenol   Complete by: As directed    Notify physician for menstrual like cramps   Complete by: As directed    Notify physician for pelvic pressure   Complete by: As directed    Notify physician for uterine contractions.  These may be painless and feel like the uterus is tightening or the baby is  "balling up"   Complete by: As directed    Notify physician for vaginal bleeding   Complete by: As directed    PRETERM LABOR:  Includes any of the follwing symptoms that occur between 20 - [redacted] weeks gestation.  If these symptoms are not  stopped, preterm labor can result in preterm delivery, placing your baby at risk   Complete by: As directed      Allergies as of 04/07/2020      Reactions   Penicillins Itching   Rash with minor swelling (CHILDHOOD REACTION)      Medication List    TAKE these medications   acetaminophen 325 MG tablet Commonly known as: TYLENOL Take 650 mg by mouth every 6 (six) hours as needed.   dexamethasone 6 MG tablet Commonly known as: DECADRON Take 1 tablet (6 mg total) by mouth daily for 5 days.   guaiFENesin-dextromethorphan 100-10 MG/5ML syrup Commonly known as: ROBITUSSIN DM Take 5 mLs by mouth every 4 (four) hours as needed for cough.   pantoprazole 40 MG tablet Commonly known as: PROTONIX Take 1 tablet (40 mg total) by mouth daily. Start taking on: April 08, 2020   PRENATAL PO Take 1 tablet by mouth daily.        Signed: [name] 04/07/2020, 10:35 AM

## 2020-04-07 NOTE — Progress Notes (Signed)
S: Feeling much better now. Hopes to be discharged home today.   O:  Vitals:   04/07/20 0500 04/07/20 0617  BP:  112/80  Pulse:  61  Resp:  18  Temp:  98.2 F (36.8 C)  SpO2: 95% 97%    A/P: 33.4 weeks         - Fetal well being>NST cat one         -COVID pneumonia          -- Remdesivir, solumedrol          -- PPx Lovenox         -- LFTs continue to downtrend and platelets normal         -- Appreciate management per hospitalist

## 2020-04-24 LAB — OB RESULTS CONSOLE GBS: GBS: NEGATIVE

## 2020-05-05 ENCOUNTER — Inpatient Hospital Stay (HOSPITAL_COMMUNITY): Payer: Managed Care, Other (non HMO) | Admitting: Anesthesiology

## 2020-05-05 ENCOUNTER — Encounter (HOSPITAL_COMMUNITY): Payer: Self-pay | Admitting: Obstetrics and Gynecology

## 2020-05-05 ENCOUNTER — Inpatient Hospital Stay (HOSPITAL_COMMUNITY)
Admission: AD | Admit: 2020-05-05 | Discharge: 2020-05-07 | DRG: 807 | Disposition: A | Discharge status: Home / Self Care | Payer: Managed Care, Other (non HMO) | Attending: Obstetrics and Gynecology | Admitting: Obstetrics and Gynecology

## 2020-05-05 ENCOUNTER — Other Ambulatory Visit: Payer: Self-pay

## 2020-05-05 DIAGNOSIS — Z3490 Encounter for supervision of normal pregnancy, unspecified, unspecified trimester: Secondary | ICD-10-CM

## 2020-05-05 DIAGNOSIS — Z20822 Contact with and (suspected) exposure to covid-19: Secondary | ICD-10-CM | POA: Diagnosis present

## 2020-05-05 DIAGNOSIS — Z3A37 37 weeks gestation of pregnancy: Secondary | ICD-10-CM

## 2020-05-05 DIAGNOSIS — Z8616 Personal history of COVID-19: Secondary | ICD-10-CM | POA: Diagnosis not present

## 2020-05-05 DIAGNOSIS — O26893 Other specified pregnancy related conditions, third trimester: Secondary | ICD-10-CM | POA: Diagnosis present

## 2020-05-05 LAB — RESP PANEL BY RT-PCR (FLU A&B, COVID) ARPGX2
Influenza A by PCR: NEGATIVE
Influenza B by PCR: NEGATIVE
SARS Coronavirus 2 by RT PCR: NEGATIVE

## 2020-05-05 LAB — CBC
HCT: 30.5 % — ABNORMAL LOW (ref 36.0–46.0)
Hemoglobin: 10.7 g/dL — ABNORMAL LOW (ref 12.0–15.0)
MCH: 32.2 pg (ref 26.0–34.0)
MCHC: 35.1 g/dL (ref 30.0–36.0)
MCV: 91.9 fL (ref 80.0–100.0)
Platelets: 278 10*3/uL (ref 150–400)
RBC: 3.32 MIL/uL — ABNORMAL LOW (ref 3.87–5.11)
RDW: 14.4 % (ref 11.5–15.5)
WBC: 20 10*3/uL — ABNORMAL HIGH (ref 4.0–10.5)
nRBC: 0 % (ref 0.0–0.2)

## 2020-05-05 LAB — TYPE AND SCREEN
ABO/RH(D): A POS
Antibody Screen: NEGATIVE

## 2020-05-05 MED ORDER — IBUPROFEN 600 MG PO TABS
600.0000 mg | ORAL_TABLET | Freq: Four times a day (QID) | ORAL | Status: DC
  Administered 2020-05-05 – 2020-05-07 (×7): 600 mg via ORAL
  Filled 2020-05-05 (×7): qty 1

## 2020-05-05 MED ORDER — OXYCODONE-ACETAMINOPHEN 5-325 MG PO TABS: 1.0000 | ORAL_TABLET | ORAL | Status: DC | PRN

## 2020-05-05 MED ORDER — OXYCODONE HCL 5 MG PO TABS: 10.0000 mg | ORAL_TABLET | ORAL | Status: DC | PRN

## 2020-05-05 MED ORDER — ACETAMINOPHEN 325 MG PO TABS: 650.0000 mg | ORAL_TABLET | ORAL | Status: DC | PRN

## 2020-05-05 MED ORDER — OXYCODONE-ACETAMINOPHEN 5-325 MG PO TABS: 2.0000 | ORAL_TABLET | ORAL | Status: DC | PRN

## 2020-05-05 MED ORDER — ONDANSETRON HCL 4 MG/2ML IJ SOLN
4.0000 mg | Freq: Four times a day (QID) | INTRAMUSCULAR | Status: DC | PRN
  Administered 2020-05-05: 4 mg via INTRAVENOUS
  Filled 2020-05-05: qty 2

## 2020-05-05 MED ORDER — SOD CITRATE-CITRIC ACID 500-334 MG/5ML PO SOLN: 30.0000 mL | ORAL | Status: DC | PRN

## 2020-05-05 MED ORDER — LACTATED RINGERS IV SOLN: 500.0000 mL | INTRAVENOUS | Status: DC | PRN

## 2020-05-05 MED ORDER — LIDOCAINE HCL (PF) 1 % IJ SOLN
INTRAMUSCULAR | Status: DC | PRN
  Administered 2020-05-05: 10 mL via EPIDURAL
  Administered 2020-05-05: 2 mL via EPIDURAL

## 2020-05-05 MED ORDER — LACTATED RINGERS IV SOLN: INTRAVENOUS | Status: DC

## 2020-05-05 MED ORDER — DIPHENHYDRAMINE HCL 50 MG/ML IJ SOLN
12.5000 mg | INTRAMUSCULAR | Status: DC | PRN
Start: 2020-05-05 — End: 2020-05-05

## 2020-05-05 MED ORDER — FENTANYL CITRATE (PF) 100 MCG/2ML IJ SOLN
100.0000 ug | INTRAMUSCULAR | Status: DC | PRN
  Administered 2020-05-05 (×2): 100 ug via INTRAVENOUS
  Filled 2020-05-05 (×2): qty 2

## 2020-05-05 MED ORDER — FENTANYL-BUPIVACAINE-NACL 0.5-0.125-0.9 MG/250ML-% EP SOLN
12.0000 mL/h | EPIDURAL | Status: DC | PRN
Start: 2020-05-05 — End: 2020-05-05
  Filled 2020-05-05: qty 250

## 2020-05-05 MED ORDER — DIBUCAINE (PERIANAL) 1 % EX OINT
1.0000 "application " | TOPICAL_OINTMENT | CUTANEOUS | Status: DC | PRN
  Administered 2020-05-07: 1 via RECTAL
  Filled 2020-05-05: qty 28

## 2020-05-05 MED ORDER — PHENYLEPHRINE 40 MCG/ML (10ML) SYRINGE FOR IV PUSH (FOR BLOOD PRESSURE SUPPORT): 80.0000 ug | PREFILLED_SYRINGE | INTRAVENOUS | Status: DC | PRN

## 2020-05-05 MED ORDER — BENZOCAINE-MENTHOL 20-0.5 % EX AERO
1.0000 "application " | INHALATION_SPRAY | CUTANEOUS | Status: DC | PRN
  Administered 2020-05-06: 1 via TOPICAL
  Filled 2020-05-05: qty 56

## 2020-05-05 MED ORDER — ZOLPIDEM TARTRATE 5 MG PO TABS: 5.0000 mg | ORAL_TABLET | Freq: Every evening | ORAL | Status: DC | PRN

## 2020-05-05 MED ORDER — FENTANYL CITRATE (PF) 100 MCG/2ML IJ SOLN: 50.0000 ug | INTRAMUSCULAR | Status: DC | PRN

## 2020-05-05 MED ORDER — FLEET ENEMA 7-19 GM/118ML RE ENEM: 1.0000 | ENEMA | RECTAL | Status: DC | PRN

## 2020-05-05 MED ORDER — WITCH HAZEL-GLYCERIN EX PADS
1.0000 "application " | MEDICATED_PAD | CUTANEOUS | Status: DC | PRN
  Administered 2020-05-07: 1 via TOPICAL

## 2020-05-05 MED ORDER — PHENYLEPHRINE 40 MCG/ML (10ML) SYRINGE FOR IV PUSH (FOR BLOOD PRESSURE SUPPORT)
80.0000 ug | PREFILLED_SYRINGE | INTRAVENOUS | Status: DC | PRN
  Filled 2020-05-05: qty 10

## 2020-05-05 MED ORDER — OXYCODONE HCL 5 MG PO TABS: 5.0000 mg | ORAL_TABLET | ORAL | Status: DC | PRN

## 2020-05-05 MED ORDER — OXYTOCIN BOLUS FROM INFUSION
333.0000 mL | Freq: Once | INTRAVENOUS | Status: AC
  Administered 2020-05-05: 333 mL via INTRAVENOUS

## 2020-05-05 MED ORDER — LIDOCAINE HCL (PF) 1 % IJ SOLN: 30.0000 mL | INTRAMUSCULAR | Status: DC | PRN

## 2020-05-05 MED ORDER — COCONUT OIL OIL: 1.0000 "application " | TOPICAL_OIL | Status: DC | PRN

## 2020-05-05 MED ORDER — EPHEDRINE 5 MG/ML INJ: 10.0000 mg | INTRAVENOUS | Status: DC | PRN

## 2020-05-05 MED ORDER — SIMETHICONE 80 MG PO CHEW: 80.0000 mg | CHEWABLE_TABLET | ORAL | Status: DC | PRN

## 2020-05-05 MED ORDER — LACTATED RINGERS IV BOLUS
1000.0000 mL | Freq: Once | INTRAVENOUS | Status: AC
  Administered 2020-05-05: 1000 mL via INTRAVENOUS

## 2020-05-05 MED ORDER — OXYTOCIN-SODIUM CHLORIDE 30-0.9 UT/500ML-% IV SOLN
2.5000 [IU]/h | INTRAVENOUS | Status: DC
  Filled 2020-05-05: qty 500

## 2020-05-05 MED ORDER — ONDANSETRON HCL 4 MG/2ML IJ SOLN: 4.0000 mg | INTRAMUSCULAR | Status: DC | PRN

## 2020-05-05 MED ORDER — PRENATAL MULTIVITAMIN CH
1.0000 | ORAL_TABLET | Freq: Every day | ORAL | Status: DC
  Administered 2020-05-06 – 2020-05-07 (×2): 1 via ORAL
  Filled 2020-05-05 (×2): qty 1

## 2020-05-05 MED ORDER — DIPHENHYDRAMINE HCL 25 MG PO CAPS: 25.0000 mg | ORAL_CAPSULE | Freq: Four times a day (QID) | ORAL | Status: DC | PRN

## 2020-05-05 MED ORDER — LACTATED RINGERS IV SOLN: 500.0000 mL | Freq: Once | INTRAVENOUS | Status: DC

## 2020-05-05 MED ORDER — ONDANSETRON HCL 4 MG PO TABS: 4.0000 mg | ORAL_TABLET | ORAL | Status: DC | PRN

## 2020-05-05 MED ORDER — SODIUM CHLORIDE (PF) 0.9 % IJ SOLN
INTRAMUSCULAR | Status: DC | PRN
  Administered 2020-05-05: 12 mL/h via EPIDURAL

## 2020-05-05 MED ORDER — TETANUS-DIPHTH-ACELL PERTUSSIS 5-2.5-18.5 LF-MCG/0.5 IM SUSY: 0.5000 mL | PREFILLED_SYRINGE | Freq: Once | INTRAMUSCULAR | Status: DC

## 2020-05-05 MED ORDER — SENNOSIDES-DOCUSATE SODIUM 8.6-50 MG PO TABS
2.0000 | ORAL_TABLET | ORAL | Status: DC
  Administered 2020-05-05 – 2020-05-07 (×2): 2 via ORAL
  Filled 2020-05-05 (×2): qty 2

## 2020-05-05 NOTE — Anesthesia Procedure Notes (Signed)
Epidural Patient location during procedure: OB Start time: 05/05/2020 3:58 PM End time: 05/05/2020 4:05 PM  Staffing Anesthesiologist: Lannie Fields, DO Performed: anesthesiologist   Preanesthetic Checklist Completed: patient identified, IV checked, risks and benefits discussed, monitors and equipment checked, pre-op evaluation and timeout performed  Epidural Patient position: sitting Prep: DuraPrep and site prepped and draped Patient monitoring: continuous pulse ox, blood pressure, heart rate and cardiac monitor Approach: midline Location: L3-L4 Injection technique: LOR air  Needle:  Needle type: Tuohy  Needle gauge: 17 G Needle length: 9 cm Needle insertion depth: 6 cm Catheter type: closed end flexible Catheter size: 19 Gauge Catheter at skin depth: 11 cm Test dose: negative  Assessment Sensory level: T8 Events: blood not aspirated, injection not painful, no injection resistance, no paresthesia and negative IV test  Additional Notes Patient identified. Risks/Benefits/Options discussed with patient including but not limited to bleeding, infection, nerve damage, paralysis, failed block, incomplete pain control, headache, blood pressure changes, nausea, vomiting, reactions to medication both or allergic, itching and postpartum back pain. Confirmed with bedside nurse the patient's most recent platelet count. Confirmed with patient that they are not currently taking any anticoagulation, have any bleeding history or any family history of bleeding disorders. Patient expressed understanding and wished to proceed. All questions were answered. Sterile technique was used throughout the entire procedure. Please see nursing notes for vital signs. Test dose was given through epidural catheter and negative prior to continuing to dose epidural or start infusion. Warning signs of high block given to the patient including shortness of breath, tingling/numbness in hands, complete motor  block, or any concerning symptoms with instructions to call for help. Patient was given instructions on fall risk and not to get out of bed. All questions and concerns addressed with instructions to call with any issues or inadequate analgesia.  Reason for block:procedure for pain

## 2020-05-05 NOTE — MAU Note (Signed)
CTX 5 mins apart.  Denies LOF.  Had some blood in her mucous plug she lost 2 days ago but no frank bleeding.  Endorses + FM.  2/80% on Monday.

## 2020-05-05 NOTE — Anesthesia Preprocedure Evaluation (Signed)
Anesthesia Evaluation  Patient identified by MRN, date of birth, ID band Patient awake    Reviewed: Allergy & Precautions, Patient's Chart, lab work & pertinent test results  Airway Mallampati: II  TM Distance: >3 FB Neck ROM: Full    Dental no notable dental hx.    Pulmonary pneumonia, unresolved,  covid positive Nov 2021, still has persistent cough   Pulmonary exam normal breath sounds clear to auscultation       Cardiovascular negative cardio ROS Normal cardiovascular exam Rhythm:Regular Rate:Normal     Neuro/Psych  Headaches, PSYCHIATRIC DISORDERS Anxiety    GI/Hepatic Neg liver ROS, GERD  Controlled and Medicated,  Endo/Other  Obesity BMI 34  Renal/GU negative Renal ROS  negative genitourinary   Musculoskeletal negative musculoskeletal ROS (+)   Abdominal   Peds negative pediatric ROS (+)  Hematology  (+) Blood dyscrasia, anemia , hct 30.5, plt 278   Anesthesia Other Findings   Reproductive/Obstetrics (+) Pregnancy                             Anesthesia Physical Anesthesia Plan  ASA: III and emergent  Anesthesia Plan: Epidural   Post-op Pain Management:    Induction:   PONV Risk Score and Plan: 2  Airway Management Planned: Natural Airway  Additional Equipment: None  Intra-op Plan:   Post-operative Plan:   Informed Consent: I have reviewed the patients History and Physical, chart, labs and discussed the procedure including the risks, benefits and alternatives for the proposed anesthesia with the patient or authorized representative who has indicated his/her understanding and acceptance.       Plan Discussed with:   Anesthesia Plan Comments:         Anesthesia Quick Evaluation

## 2020-05-05 NOTE — H&P (Signed)
Malina Geers is a 29 y.o. female presenting for UCs. Pregnancy complicated by right ovarian cyst> Rt ovarian cystectomy for a benign cyst. She was hospitalized with Covid pneumonia 04/03/20 and received Remdesovir. No fever or SOB. OB History    Gravida  1   Para      Term      Preterm      AB      Living        SAB      IAB      Ectopic      Multiple      Live Births             Past Medical History:  Diagnosis Date  . Anxiety   . Family history of adverse reaction to anesthesia    mom has nausea  . GERD (gastroesophageal reflux disease)   . Heart murmur    when younger told had a heart mummur no problems  . Migraines    Past Surgical History:  Procedure Laterality Date  . LAPAROSCOPY    . Procedure on Retina    . ROBOTIC ASSISTED LAPAROSCOPIC OVARIAN CYSTECTOMY N/A 11/25/2019   Procedure: XI ROBOTIC ASSISTED LAPAROSCOPIC UNILATERAL OVARIAN CYSTECTOMY/UNILATERALOOPORECTOMY;  Surgeon: Adolphus Birchwood, MD;  Location: [facility];  Service: Gynecology;  Laterality: N/A;  . WISDOM TOOTH EXTRACTION     Family History: family history includes Cancer in her father and maternal aunt; Diabetes in her maternal grandfather; Heart disease in her maternal grandfather, maternal grandmother, and another family member. Social History:  reports that she has never smoked. She has never used smokeless tobacco. She reports previous alcohol use. She reports previous drug use.     Maternal Diabetes: No Genetic Screening: Normal Maternal Ultrasounds/Referrals: Normal Fetal Ultrasounds or other Referrals:  None Maternal Substance Abuse:  No Significant Maternal Medications:  None Significant Maternal Lab Results:  Group B Strep negative Other Comments:  None  Review of Systems  Constitutional: Negative for fever.  Eyes: Negative for visual disturbance.  Gastrointestinal: Negative for abdominal pain.  Neurological: Negative for headaches.   Maternal Medical History:  Reason for  admission: Contractions.   Contractions: Onset was 3-5 hours ago.    Fetal activity: Perceived fetal activity is normal.      Dilation: 5 Effacement (%): 90 Station: -1 Exam by:: Ramond Darnell Blood pressure (!) 143/87, pulse 65, temperature 98.6 F (37 C), resp. rate 18, weight 93.5 kg. Maternal Exam:  Abdomen: Fetal presentation: vertex     Fetal Exam Fetal State Assessment: Category I - tracings are normal.     Physical Exam Cardiovascular:     Rate and Rhythm: Normal rate.  Pulmonary:     Effort: Pulmonary effort is normal.     Cx 5/C/-1/vtx AROM clear FHT cat one  Prenatal labs: ABO, Rh: --/--/A POS (12/17 1335) Antibody: NEG (12/17 1335) Rubella: Immune (05/27 0000) RPR: Nonreactive (05/27 0000)  HBsAg: Negative (05/27 0000)  HIV: Non-reactive (05/27 0000)  GBS: Negative/-- (12/06 0000)   Assessment/Plan: 29 yo G1P0 in active labor   Roselle Locus II 05/05/2020, 4:21 PM

## 2020-05-05 NOTE — Lactation Note (Signed)
This note was copied from a baby's chart. Lactation Consultation Note  Patient Name: Theresa Christensen Date: 05/05/2020 Reason for consult: Initial assessment;Mother's request;Primapara;Early term 37-38.6wks  Infant is 37 weeks latched in L and D with RN. Infant little shallow a the breast, LC assisted to get a deeper latch. Infant lips flanged but did not initiate a suck.   LC reviewed with family members how to do breast massage and hand expression, feeding cues to observe and feeding based on cues 8-12 x in 24 hour period no more than 4 hours without an attempt.  Mataya Kilduff  Nicholson-Springer 05/05/2020, 7:48 PM

## 2020-05-05 NOTE — Progress Notes (Signed)
Operative Delivery Note At 6:56 PM a viable female was delivered via Vaginal, Vac[employer], operational).  Presentation: vertex; Position: Occiput,, Anterior; Station: +4.  Verbal consent: obtained from patient.  Risks and benefits discussed in detail.  Risks include, but are not limited to the risks of anesthesia, bleeding, infection, damage to maternal tissues, fetal cephalhematoma.  There is also the risk of inability to effect vaginal delivery of the head, or shoulder dystocia that cannot be resolved by established maneuvers, leading to the need for emergency cesarean section. Deep late decels with pushing APGAR: 8, 9; weight  .   Placenta status: intact,to path .   Cord:  3 vessels with the following complications:over shoulder x 1 .  Cord pH: pending  Anesthesia:  none Instruments: Kiwi VE x 2-3 UCs, no popoffs Episiotomy: None Lacerations: 2nd degree ML laceration with right sulcus extension repaired Suture Repair: 2.0 vicryl rapide Est. Blood Loss (mL):  300  Mom to postpartum.  Baby to Couplet care / Skin to Skin.  Roselle Locus II 05/05/2020, 7:25 PM

## 2020-05-06 LAB — CBC
HCT: 25.9 % — ABNORMAL LOW (ref 36.0–46.0)
Hemoglobin: 8.6 g/dL — ABNORMAL LOW (ref 12.0–15.0)
MCH: 31.2 pg (ref 26.0–34.0)
MCHC: 33.2 g/dL (ref 30.0–36.0)
MCV: 93.8 fL (ref 80.0–100.0)
Platelets: 218 10*3/uL (ref 150–400)
RBC: 2.76 MIL/uL — ABNORMAL LOW (ref 3.87–5.11)
RDW: 14.6 % (ref 11.5–15.5)
WBC: 16.6 10*3/uL — ABNORMAL HIGH (ref 4.0–10.5)
nRBC: 0 % (ref 0.0–0.2)

## 2020-05-06 LAB — RPR: RPR Ser Ql: NONREACTIVE

## 2020-05-06 NOTE — Anesthesia Postprocedure Evaluation (Signed)
Anesthesia Post Note  Patient: Theresa Christensen  Procedure(s) Performed: AN AD HOC LABOR EPIDURAL     Patient location during evaluation: Mother Baby Anesthesia Type: Epidural Level of consciousness: awake and alert Pain management: pain level controlled Vital Signs Assessment: post-procedure vital signs reviewed and stable Respiratory status: spontaneous breathing Cardiovascular status: stable Postop Assessment: no headache, adequate PO intake, no backache, patient able to bend at knees, able to ambulate, epidural receding and no apparent nausea or vomiting Anesthetic complications: no   No complications documented.  Last Vitals:  Vitals:   05/05/20 2245 05/06/20 0345  BP: 113/72 114/80  Pulse: 80 70  Resp: 16 18  Temp: 36.7 C 36.6 C  SpO2: 100% 100%    Last Pain:  Vitals:   05/06/20 0650  TempSrc:   PainSc: 6    Pain Goal:                   Salome Arnt

## 2020-05-06 NOTE — Progress Notes (Signed)
Post Partum Day 1 Subjective: no complaints, up ad lib, voiding, tolerating PO and + flatus  Objective: Blood pressure 114/80, pulse 70, temperature 97.8 F (36.6 C), temperature source Oral, resp. rate 18, weight 93.5 kg, SpO2 100 %, unknown if currently breastfeeding.  Physical Exam:  General: alert, cooperative and no distress Lochia: appropriate Uterine Fundus: firm Incision: healing well DVT Evaluation: No evidence of DVT seen on physical exam.  Recent Labs    05/05/20 1339 05/06/20 0428  HGB 10.7* 8.6*  HCT 30.5* 25.9*    Assessment/Plan: Plan for discharge tomorrow   LOS: 1 day   Roselle Locus II 05/06/2020, 8:27 AM

## 2020-05-06 NOTE — Lactation Note (Signed)
This note was copied from a baby's chart. Lactation Consultation Note Baby 10 hrs old. Finally cueing. LC had difficulty latching at first. Baby would open mouth hardly at all, when she did she would only do fish lips and suckle on tip of nipple. Mom has everted nipples that evert more w/stimulation. Gave mom shells to wear to assist in everting more.  Mom hasn't been able to get baby to have good feeding. Baby has thick labial frenulum. Encouraged mom to flange lips once on the breast.  Mom shown how to use DEBP & how to disassemble, clean, & reassemble parts. Mom knows to pump q3h for 15-20 min. Mom encouraged to feed baby 8-12 times/24 hours and with feeding cues.   Newborn behavior, STS, I&O, hand expression, breast massage, positioning, support, safety, supply and demand discussed.  Baby latched w/mom using "C" hold to the breast. Rare swallow heard. Noted a few drops of thick colostrum noted.  Instructed mom and FOB to flange upper and lower lips to obtain deeper latch. This had to be done frequently. Baby had significant bruising top of head.  Encouraged to call for support or questions. Lactation brochure given.    Patient Name: Girl Callie Facey [id number] Date: 05/06/2020 Reason for consult: Initial assessment;Primapara;Early term 37-38.6wks   Maternal Data Has patient been taught Hand Expression?: Yes Does the patient have breastfeeding experience prior to this delivery?: No  Feeding Feeding Type: Breast Fed  LATCH Score Latch: Repeated attempts needed to sustain latch, nipple held in mouth throughout feeding, stimulation needed to elicit sucking reflex.  Audible Swallowing: None  Type of Nipple: Everted at rest and after stimulation  Comfort (Breast/Nipple): Soft / non-tender  Hold (Positioning): Assistance needed to correctly position infant at breast and maintain latch.  LATCH Score: 6  Interventions Interventions: Breast feeding basics  reviewed;Adjust position;DEBP;Assisted with latch;Support pillows;Skin to skin;Position options;Breast massage;Hand express;Pre-pump if needed;Shells;Breast compression;Hand pump  Lactation Tools Discussed/Used Tools: Pump;Shells;Nipple Shields Nipple shield size: 20 Shell Type: Inverted Breast pump type: Double-Electric Breast Pump WIC Program: No Pump Review: Setup, frequency, and cleaning;Milk Storage Initiated by:: Peri Jefferson RN IBCLC Date initiated:: 05/06/20   Consult Status Consult Status: Follow-up Date: 05/06/20 Follow-up type: In-patient    Emagene Merfeld, Diamond Nickel 05/06/2020, 5:08 AM

## 2020-05-06 NOTE — Progress Notes (Signed)
MOB was referred for history of depression/anxiety. * Referral screened out by Clinical Social Worker because none of the following criteria appear to apply: ~ History of anxiety/depression during this pregnancy, or of post-partum depression following prior delivery. ~ Diagnosis of anxiety and/or depression within last 3 years. No concerns noted in OB records. OR * MOB's symptoms currently being treated with medication and/or therapy.  Please contact the Clinical Social Worker if needs arise, by MOB request, or if MOB scores greater than 9/yes to question 10 on Edinburgh Postpartum Depression Screen.   Joydan Gretzinger Boyd-Gilyard, MSW, LCSW Clinical Social Work [phone]  

## 2020-05-06 NOTE — Lactation Note (Signed)
This note was copied from a baby's chart. Lactation Consultation Note Baby 5 hrs old. LC attempted to see mom. Mom awake. Room dark. Mom stated she just finished feeding the baby no long ago. Mom stated she is latching, she isn't sure if she is getting anything or not. Asked mom to call for [facility] for next feeding. Rest while baby is resting. Mom stated OK.  Patient Name: Theresa Christensen [id number] Date: 05/06/2020     Maternal Data    Feeding Feeding Type: Breast Fed  LATCH Score Latch: Repeated attempts needed to sustain latch, nipple held in mouth throughout feeding, stimulation needed to elicit sucking reflex.  Audible Swallowing: None  Type of Nipple: Everted at rest and after stimulation (short shafted)  Comfort (Breast/Nipple): Soft / non-tender  Hold (Positioning): No assistance needed to correctly position infant at breast.  LATCH Score: 7  Interventions Interventions: Breast compression;Assisted with latch;Breast feeding basics reviewed;Hand express;Support pillows  Lactation Tools Discussed/Used     Consult Status      Theresa Christensen, Diamond Nickel 05/06/2020, 12:50 AM

## 2020-05-07 MED ORDER — FERROUS SULFATE 325 (65 FE) MG PO TBEC: 325.0000 mg | DELAYED_RELEASE_TABLET | Freq: Every day | ORAL | 3 refills | Status: DC

## 2020-05-07 MED ORDER — IBUPROFEN 600 MG PO TABS: 600.0000 mg | ORAL_TABLET | Freq: Four times a day (QID) | ORAL | 0 refills | Status: DC | PRN

## 2020-05-07 MED ORDER — WITCH HAZEL-GLYCERIN EX PADS: 1.0000 "application " | MEDICATED_PAD | CUTANEOUS | 12 refills | Status: DC | PRN

## 2020-05-07 MED ORDER — DIBUCAINE (PERIANAL) 1 % EX OINT: 1.0000 "application " | TOPICAL_OINTMENT | CUTANEOUS | 1 refills | Status: DC | PRN

## 2020-05-07 NOTE — Lactation Note (Signed)
This note was copied from a baby's chart. Lactation Consultation Note  Patient Name: Theresa Christensen [id number] Date: 05/07/2020 Reason for consult: Follow-up assessment;1st time breastfeeding P1, 29 hour ETI female infant. LC discussed  Infant's input and output and per mom, infant had 6 voids and 6 stools since birth. Per mom, she feels breast feeding is going well now and infant is breastfeeding for 20 minutes most feedings. Mom doesn't have any questions or concerns for LC at this time. LC did not observe latch, infant asleep in basinet.  Mom will continue to BF infant according to cues, 8 to 12+ times within 24 hours, STS. Mom understands to call RN or LC  services if she has any breastfeeding questions, concerns or need assistance with latching infant at the breast.   Maternal Data    Feeding Feeding Type: Breast Fed  LATCH Score                   Interventions Interventions: Skin to skin;Breast compression  Lactation Tools Discussed/Used     Consult Status Consult Status: Follow-up Date: 05/07/20 Follow-up type: In-patient    [name] 05/07/2020, 12:47 AM

## 2020-05-07 NOTE — Lactation Note (Signed)
This note was copied from a baby's chart. Lactation Consultation Note  Patient Name: Theresa Christensen [name] Date: 05/07/2020 Reason for consult: Follow-up assessment  Follow up with 42 hours old with 5.43% weight loss at the time of visit. Baby is sleeping in mother's arms upon arrival. Mother states breastfeeding is going well and does not reports any problems at this point. Infant has had 3 stools and 2 voids today. Mother reports sensitive nipples. Encouraged mother to use EBM to nipples, air-dry and apply comfort gels.   Reviewed with mother average size of a NB stomach. Encourage to follow babies' hunger and fullness cues. Reinforced importance to offer the breast 8 to 12 times in a 24-hour period for proper stimulation and to establish good milk supply. Talked about pacifier use, potential impact on milk supply and hunger cues. Discussed signs of effective milk transfer. Reviewed signs of milk coming to volume. Reviewed newborn behavior and expectations with mother. Promoted maternal rest, hydration and nutrition.   Encouraged to contact [facility] for support, questions or concerns. Promoted INJoy booklet for additional information. All questions answered at this time.    Feeding Feeding Type: Breast Fed  Interventions Interventions: Breast feeding basics reviewed;Expressed milk;Hand express;Breast massage;Comfort gels  Consult Status Consult Status: Complete Date: 05/07/20 Follow-up type: Call as needed    Britley Gashi A Higuera Ancidey 05/07/2020, 1:25 PM

## 2020-05-07 NOTE — Discharge Summary (Signed)
Postpartum Discharge Summary  Date of Service updated 05/07/20     Patient Name: Theresa Christensen DOB: 1990/12/28 MRN: [id number]  Date of admission: 05/05/2020 Delivery date:05/05/2020  Delivering provider: Everlene Farrier  Date of discharge: 05/07/2020  Admitting diagnosis: Term pregnancy [Z34.90] Intrauterine pregnancy: [redacted]w[redacted]d    Secondary diagnosis:  Active Problems:   Term pregnancy  Additional problems:     Discharge diagnosis: Term Pregnancy Delivered                                              Post partum procedures: Augmentation:  Complications: None  Hospital course: Onset of Labor With Vaginal Delivery      29y.o. yo G1P1001 at 346w4das admitted in Active Labor on 05/05/2020. Patient had an uncomplicated labor course as follows:  Membrane Rupture Time/Date: 4:15 PM ,05/05/2020   Delivery Method:Vaginal, Vacuum (Extractor)  Episiotomy: None  Lacerations:  2nd degree;Perineal  Patient had an uncomplicated postpartum course.  She is ambulating, tolerating a regular diet, passing flatus, and urinating well. Patient is discharged home in stable condition on 05/07/20.  Newborn Data: Birth date:05/05/2020  Birth time:6:56 PM  Gender:Female  Living status:Living  Apgars:8 ,9  Weight:3205 g   Magnesium Sulfate received: No BMZ received: No Rhophylac:No MMR:No T-DaP:Given prenatally Flu: No Transfusion:No  Physical exam  Vitals:   05/06/20 1148 05/06/20 1430 05/06/20 2220 05/07/20 0404  BP: 109/69 115/77 128/84 127/84  Pulse: 78 79 67 70  Resp: 18 18 18 16   Temp: 98.2 F (36.8 C) 99.1 F (37.3 C) 98.7 F (37.1 C) 98 F (36.7 C)  TempSrc: Oral Oral Oral Oral  SpO2: 100% 99% 99% 98%  Weight:       General: alert, cooperative and no distress Lochia: appropriate Uterine Fundus: firm Incision: Healing well with no significant drainage DVT Evaluation: No evidence of DVT seen on physical exam. Labs: Lab Results  Component Value Date   WBC 16.6  (H) 05/06/2020   HGB 8.6 (L) 05/06/2020   HCT 25.9 (L) 05/06/2020   MCV 93.8 05/06/2020   PLT 218 05/06/2020   CMP Latest Ref Rng & Units 04/07/2020  Glucose 70 - 99 mg/dL 93  BUN 6 - 20 mg/dL 11  Creatinine 0.44 - 1.00 mg/dL 0.67  Sodium 135 - 145 mmol/L 141  Potassium 3.5 - 5.1 mmol/L 3.9  Chloride 98 - 111 mmol/L 113(H)  CO2 22 - 32 mmol/L 21(L)  Calcium 8.9 - 10.3 mg/dL 8.1(L)  Total Protein 6.5 - 8.1 g/dL 5.2(L)  Total Bilirubin 0.3 - 1.2 mg/dL 0.4  Alkaline Phos 38 - 126 U/L 108  AST 15 - 41 U/L 84(H)  ALT 0 - 44 U/L 101(H)   EdFlavia Shippercore: No flowsheet data found.    After visit meds:  Allergies as of 05/07/2020      Reactions   Penicillins Itching   Rash with minor swelling (CHILDHOOD REACTION)      Medication List    STOP taking these medications   guaiFENesin-dextromethorphan 100-10 MG/5ML syrup Commonly known as: ROBITUSSIN DM   pantoprazole 40 MG tablet Commonly known as: PROTONIX     TAKE these medications   acetaminophen 325 MG tablet Commonly known as: TYLENOL Take 650 mg by mouth every 6 (six) hours as needed.   dibucaine 1 % Oint Commonly known as: NUPERCAINAL Place 1 application rectally  as needed for hemorrhoids.   ibuprofen 600 MG tablet Commonly known as: ADVIL Take 1 tablet (600 mg total) by mouth every 6 (six) hours as needed.   PRENATAL PO Take 1 tablet by mouth daily.   witch hazel-glycerin pad Commonly known as: TUCKS Apply 1 application topically as needed for hemorrhoids.        Discharge home in stable condition Infant Feeding: Breast Infant Disposition:home with mother Discharge instruction: per After Visit Summary and Postpartum booklet. Activity: Advance as tolerated. Pelvic rest for 6 weeks.  Diet: routine diet Anticipated Birth Control: Unsure Postpartum Appointment:6 weeks Additional Postpartum F/U:  Future Appointments:No future appointments. Follow up Visit:      05/07/2020 Allena Katz,  MD

## 2020-05-09 LAB — SURGICAL PATHOLOGY

## 2020-05-22 ENCOUNTER — Inpatient Hospital Stay (HOSPITAL_COMMUNITY): Admit: 2020-05-22 | Payer: Self-pay

## 2020-07-26 ENCOUNTER — Other Ambulatory Visit: Payer: Self-pay

## 2020-07-26 ENCOUNTER — Encounter: Payer: Self-pay | Admitting: Family Medicine

## 2020-07-26 ENCOUNTER — Ambulatory Visit: Payer: Managed Care, Other (non HMO) | Admitting: Family Medicine

## 2020-07-26 VITALS — BP 111/71 | HR 79 | Temp 97.6°F | Ht 65.0 in | Wt 190.0 lb

## 2020-07-26 DIAGNOSIS — K219 Gastro-esophageal reflux disease without esophagitis: Secondary | ICD-10-CM | POA: Diagnosis not present

## 2020-07-26 MED ORDER — PANTOPRAZOLE SODIUM 40 MG PO TBEC: 40.0000 mg | DELAYED_RELEASE_TABLET | Freq: Every day | ORAL | 1 refills | Status: DC

## 2020-07-26 MED ORDER — FAMOTIDINE 20 MG PO TABS: 20.0000 mg | ORAL_TABLET | Freq: Two times a day (BID) | ORAL | 3 refills | Status: DC

## 2020-07-26 NOTE — Patient Instructions (Addendum)
Gastroesophageal Reflux Disease, Adult  Gastroesophageal reflux (GER) happens when acid from the stomach flows up into the tube that connects the mouth and the stomach (esophagus). Normally, food travels down the esophagus and stays in the stomach to be digested. With GER, food and stomach acid sometimes move back up into the esophagus. You may have a disease called gastroesophageal reflux disease (GERD) if the reflux:  Happens often.  Causes frequent or very bad symptoms.  Causes problems such as damage to the esophagus. When this happens, the esophagus becomes sore and swollen. Over time, GERD can make small holes (ulcers) in the lining of the esophagus. What are the causes? This condition is caused by a problem with the muscle between the esophagus and the stomach. When this muscle is weak or not normal, it does not close properly to keep food and acid from coming back up from the stomach. The muscle can be weak because of:  Tobacco use.  Pregnancy.  Having a certain type of hernia (hiatal hernia).  Alcohol use.  Certain foods and drinks, such as coffee, chocolate, onions, and peppermint. What increases the risk?  Being overweight.  Having a disease that affects your connective tissue.  Taking NSAIDs, such a ibuprofen. What are the signs or symptoms?  Heartburn.  Difficult or painful swallowing.  The feeling of having a lump in the throat.  A bitter taste in the mouth.  Bad breath.  Having a lot of saliva.  Having an upset or bloated stomach.  Burping.  Chest pain. Different conditions can cause chest pain. Make sure you see your doctor if you have chest pain.  Shortness of breath or wheezing.  A long-term cough or a cough at night.  Wearing away of the surface of teeth (tooth enamel).  Weight loss. How is this treated?  Making changes to your diet.  Taking medicine.  Having surgery. Treatment will depend on how bad your symptoms are. Follow these  instructions at home: Eating and drinking  Follow a diet as told by your doctor. You may need to avoid foods and drinks such as: ? Coffee and tea, with or without caffeine. ? Drinks that contain alcohol. ? Energy drinks and sports drinks. ? Bubbly (carbonated) drinks or sodas. ? Chocolate and cocoa. ? Peppermint and mint flavorings. ? Garlic and onions. ? Horseradish. ? Spicy and acidic foods. These include peppers, chili powder, curry powder, vinegar, hot sauces, and BBQ sauce. ? Citrus fruit juices and citrus fruits, such as oranges, lemons, and limes. ? Tomato-based foods. These include red sauce, chili, salsa, and pizza with red sauce. ? Fried and fatty foods. These include donuts, french fries, potato chips, and high-fat dressings. ? High-fat meats. These include hot dogs, rib eye steak, sausage, ham, and bacon. ? High-fat dairy items, such as whole milk, butter, and cream cheese.  Eat small meals often. Avoid eating large meals.  Avoid drinking large amounts of liquid with your meals.  Avoid eating meals during the 2-3 hours before bedtime.  Avoid lying down right after you eat.  Do not exercise right after you eat.   Lifestyle  Do not smoke or use any products that contain nicotine or tobacco. If you need help quitting, ask your doctor.  Try to lower your stress. If you need help doing this, ask your doctor.  If you are overweight, lose an amount of weight that is healthy for you. Ask your doctor about a safe weight loss goal.   General instructions    Pay attention to any changes in your symptoms.  Take over-the-counter and prescription medicines only as told by your doctor.  Do not take aspirin, ibuprofen, or other NSAIDs unless your doctor says it is okay.  Wear loose clothes. Do not wear anything tight around your waist.  Raise (elevate) the head of your bed about 6 inches (15 cm). You may need to use a wedge to do this.  Avoid bending over if this makes your  symptoms worse.  Keep all follow-up visits. Contact a doctor if:  You have new symptoms.  You lose weight and you do not know why.  You have trouble swallowing or it hurts to swallow.  You have wheezing or a cough that keeps happening.  You have a hoarse voice.  Your symptoms do not get better with treatment. Get help right away if:  You have sudden pain in your arms, neck, jaw, teeth, or back.  You suddenly feel sweaty, dizzy, or light-headed.  You have chest pain or shortness of breath.  You vomit and the vomit is green, yellow, or black, or it looks like blood or coffee grounds.  You faint.  Your poop (stool) is red, bloody, or black.  You cannot swallow, drink, or eat. These symptoms may represent a serious problem that is an emergency. Do not wait to see if the symptoms will go away. Get medical help right away. Call your local emergency services (911 in the [place] Do not drive yourself to the hospital. Summary  If a person has gastroesophageal reflux disease (GERD), food and stomach acid move back up into the esophagus and cause symptoms or problems such as damage to the esophagus.  Treatment will depend on how bad your symptoms are.  Follow a diet as told by your doctor.  Take all medicines only as told by your doctor. This information is not intended to replace advice given to you by your health care provider. Make sure you discuss any questions you have with your health care provider. Document Revised: 11/15/2019 Document Reviewed: 11/15/2019 Elsevier Patient Education  2021 [employer].     If you have lab work done today you will be contacted with your lab results within the next 2 weeks.  If you have not heard from [place] then please contact us. The fastest way to get your results is to register for My Chart.   IF you received an x-ray today, you will receive an invoice from [facility]. Please contact [facility] at [phone] with  questions or concerns regarding your invoice.   IF you received labwork today, you will receive an invoice from [facility]. Please contact [facility] at [phone] with questions or concerns regarding your invoice.   Our billing staff will not be able to assist you with questions regarding bills from these companies.  You will be contacted with the lab results as soon as they are available. The fastest way to get your results is to activate your My Chart account. Instructions are located on the last page of this paperwork. If you have not heard from [place] regarding the results in 2 weeks, please contact this office.

## 2020-07-26 NOTE — Progress Notes (Signed)
3/9/202211:14 AM  Leonette Nutting 1990/06/16, 30 y.o., female [id number]  Chief Complaint  Patient presents with  . Abdominal Pain    Upper abdominal discomfort after meals daily , nausea and vomiting x 1 week     HPI:   Patient is a 30 y.o. female with past medical history significant for GAD who presents today for abdominal pain.  She is a new mom Baby now sleeping better at night This was her first kid 12 weeks on Friday Works from home, went back at 6 weeks  This happened about 2 years ago And had happened 2 years before that After she eats she gets epigastric pain and nausea Gets tired and this lasts for a couple hours after eats Feels like this happens every meal Was worried coffee was causing this Didn't drink Coffee today to see if that would help In the past went away with time She is concerned it may be a stomach ulcer   Elayne Gruver is a 30 y.o. female who complains of GERD type symptoms. She has been experiencing heartburn, bilious reflux, upper abdominal discomfort, symptoms primarily relate to meals, and lying down after meals for many hour(s), recurrent over time. ROS: patient denies chest pain, cough, wheezing, weight loss, black stools, hematemesis, diarrhea, constipation, fever, history of PUD, history of GI bleeding, clearing throat, shortness of breath, hemoptysis, coughing with swallowing or otaligia with swallowing. Social history: no or minimal alcohol, nonsmoker, caffeine use is moderate-to-high daily.  Current Outpatient Medications  Medication Sig Dispense Refill  . famotidine (PEPCID) 20 MG tablet Take 1 tablet (20 mg total) by mouth 2 (two) times daily. 90 tablet 3  . pantoprazole (PROTONIX) 40 MG tablet Take 1 tablet (40 mg total) by mouth daily. 30 tablet 1  . SRONYX 0.1-20 MG-MCG tablet Take 1 tablet by mouth daily.    Marland Kitchen ibuprofen (ADVIL) 600 MG tablet Take 1 tablet (600 mg total) by mouth every 6 (six) hours as needed. (Patient not  taking: Reported on 07/26/2020) 60 tablet 0  . Prenatal Vit-Fe Fumarate-FA (PRENATAL PO) Take 1 tablet by mouth daily. (Patient not taking: Reported on 07/26/2020)     No current facility-administered medications for this visit.     Depression screen [facility] 2/9 07/26/2020 02/21/2018 11/15/2017  Decreased Interest 0 0 0  Down, Depressed, Hopeless 0 0 0  PHQ - 2 Score 0 0 0  Altered sleeping - - 1  Tired, decreased energy - - 0  Change in appetite - - 1  Feeling bad or failure about yourself  - - 0  Trouble concentrating - - 0  Moving slowly or fidgety/restless - - 0  Suicidal thoughts - - 0  PHQ-9 Score - - 2  Difficult doing work/chores - - Not difficult at all    Fall Risk  02/21/2018 02/15/2017  Falls in the past year? No No     Allergies  Allergen Reactions  . Penicillins Itching    Rash with minor swelling (CHILDHOOD REACTION)    Prior to Admission medications   Medication Sig Start Date End Date Taking? Authorizing Provider  acetaminophen (TYLENOL) 325 MG tablet Take 650 mg by mouth every 6 (six) hours as needed.    [provider]  dibucaine (NUPERCAINAL) 1 % OINT Place 1 application rectally as needed for hemorrhoids. 05/07/20   Harold Hedge, MD  ferrous sulfate 325 (65 FE) MG EC tablet Take 1 tablet (325 mg total) by mouth daily. 05/07/20   Harold Hedge, MD  ibuprofen (ADVIL) 600 MG tablet Take 1 tablet (600 mg total) by mouth every 6 (six) hours as needed. 05/07/20   Harold Hedge, MD  Prenatal Vit-Fe Fumarate-FA (PRENATAL PO) Take 1 tablet by mouth daily.    [provider]  witch hazel-glycerin (TUCKS) pad Apply 1 application topically as needed for hemorrhoids. 05/07/20   Harold Hedge, MD    Past Medical History:  Diagnosis Date  . Anxiety   . Family history of adverse reaction to anesthesia    mom has nausea  . GERD (gastroesophageal reflux disease)   . Heart murmur    when younger told had a heart mummur no problems  . Migraines     Past  Surgical History:  Procedure Laterality Date  . LAPAROSCOPY    . Procedure on Retina    . ROBOTIC ASSISTED LAPAROSCOPIC OVARIAN CYSTECTOMY N/A 11/25/2019   Procedure: XI ROBOTIC ASSISTED LAPAROSCOPIC UNILATERAL OVARIAN CYSTECTOMY/UNILATERALOOPORECTOMY;  Surgeon: Adolphus Birchwood, MD;  Location: [facility];  Service: Gynecology;  Laterality: N/A;  . WISDOM TOOTH EXTRACTION      Social History   Tobacco Use  . Smoking status: Never Smoker  . Smokeless tobacco: Never Used  Substance Use Topics  . Alcohol use: Not Currently    Alcohol/week: 0.0 standard drinks    Comment: occassional    Family History  Problem Relation Age of Onset  . Heart disease Other   . Cancer Father        lung  . Heart disease Maternal Grandmother   . Diabetes Maternal Grandfather   . Heart disease Maternal Grandfather   . Cancer Maternal Aunt        breast    Review of Systems  Constitutional: Negative for chills, fever and malaise/fatigue.  Eyes: Negative for blurred vision and double vision.  Respiratory: Negative for cough, shortness of breath and wheezing.   Cardiovascular: Negative for chest pain, palpitations and leg swelling.  Gastrointestinal: Positive for heartburn, nausea and vomiting (1 episode yesterday, some food). Negative for abdominal pain, blood in stool, constipation and diarrhea.  Genitourinary: Negative for dysuria, frequency, hematuria and urgency.  Musculoskeletal: Negative for back pain and joint pain.  Skin: Negative for rash.  Neurological: Negative for dizziness, weakness and headaches.     OBJECTIVE:  Today's Vitals   07/26/20 1042  BP: 111/71  Pulse: 79  Temp: 97.6 F (36.4 C)  SpO2: 96%  Weight: 190 lb (86.2 kg)  Height: 5\' 5"  (1.651 m)   Body mass index is 31.62 kg/m.   Physical Exam Constitutional:      General: She is not in acute distress.    Appearance: Normal appearance. She is not ill-appearing.  HENT:     Head: Normocephalic.  Cardiovascular:     Rate  and Rhythm: Normal rate and regular rhythm.     Pulses: Normal pulses.     Heart sounds: Normal heart sounds. No murmur heard. No friction rub. No gallop.   Pulmonary:     Effort: Pulmonary effort is normal. No respiratory distress.     Breath sounds: Normal breath sounds. No stridor. No wheezing, rhonchi or rales.  Abdominal:     General: Bowel sounds are normal.     Palpations: Abdomen is soft.     Tenderness: There is no abdominal tenderness.  Musculoskeletal:     Right lower leg: No edema.     Left lower leg: No edema.  Skin:    General: Skin is warm and dry.  Neurological:  Mental Status: She is alert and oriented to person, place, and time.  Psychiatric:        Mood and Affect: Mood normal.        Behavior: Behavior normal.    Appears well, alert and oriented x 3, pleasant cooperative in NAD. Anicteric. Vitals as noted. Neck free of lymphadenopathy or mass. Abdomen - abdomen is soft without significant tenderness, masses, organomegaly or guarding. no CVA tenderness. no hernia noted. no rebound tenderness.. No results found for this or any previous visit (from the past 24 hour(s)).  No results found.   ASSESSMENT and PLAN  Problem List Items Addressed This Visit   None   Visit Diagnoses    Gastroesophageal reflux disease without esophagitis    -  Primary   Relevant Medications   pantoprazole (PROTONIX) 40 MG tablet   famotidine (PEPCID) 20 MG tablet       Plan The pathophysiology of reflux is discussed.  Anti-reflux measures such as raising the head of the bed, avoiding tight clothing or belts, avoiding eating late at night and not lying down shortly after mealtime and achieving weight loss are discussed. Avoid ASA, NSAID's, caffeine, peppermints, alcohol and tobacco. OTC H2 blockers and/or antacids are often very helpful for PRN use. However, for persisting chronic or daily symptoms, prescription strength H2 blockers or a trial of PPI's are often used. Further  recommendations to her: Rx for H2-blocker is written, Rx for PPI is written, medication side effects fully discussed, GI consult and EGD not indicated unless symptoms persist despite therapy. She should alert me if there are persistent symptoms, dysphagia, weight loss or GI bleeding.     Return if symptoms worsen or fail to improve.    Macario Carls Kima Malenfant, FNP-BC [facility] at [facility] [address] [place], [place] [address] Ph.  [phone] Fax [phone]

## 2020-08-17 ENCOUNTER — Other Ambulatory Visit: Payer: Self-pay | Admitting: Family Medicine

## 2020-08-17 DIAGNOSIS — K219 Gastro-esophageal reflux disease without esophagitis: Secondary | ICD-10-CM

## 2020-08-24 ENCOUNTER — Telehealth: Payer: Self-pay

## 2020-08-24 NOTE — Telephone Encounter (Signed)
attempted to call patient about medication refill request for Famotidine  From [facility].  Famotidine was refilled 07/26/2020 for 90 tablets and 3 refills sent to [facility] in [place].

## 2020-12-28 IMAGING — US US ABDOMEN LIMITED
2 series · 15 of 25 positions shown · non-contrast
Comparison: 09/05/2011

CLINICAL DATA: Elevated LFTs

EXAM:
ULTRASOUND ABDOMEN LIMITED RIGHT UPPER QUADRANT

[Series 1: us abdomen limited · 14 of 35 slices shown (1 of 2)]
[im 1/35]
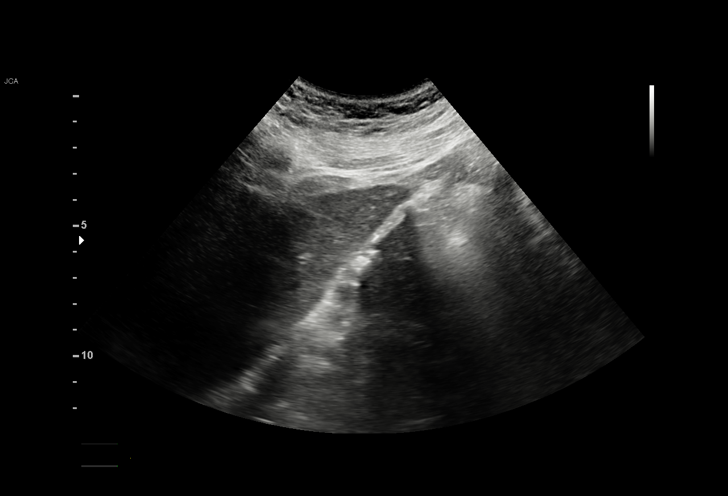
[im 3/35]
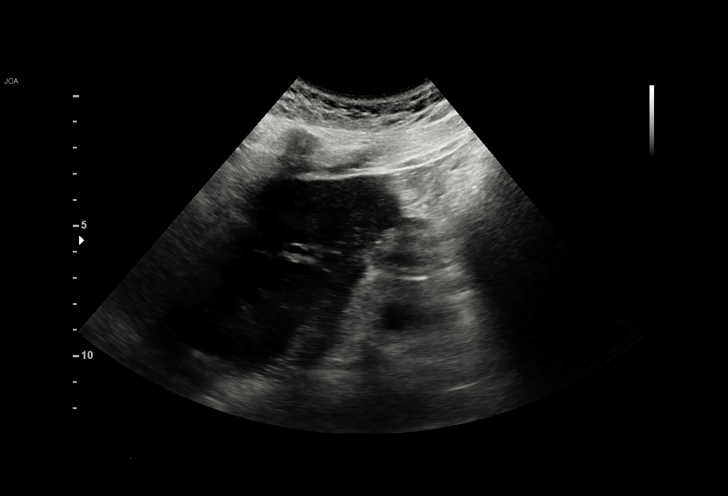
[im 6/35]
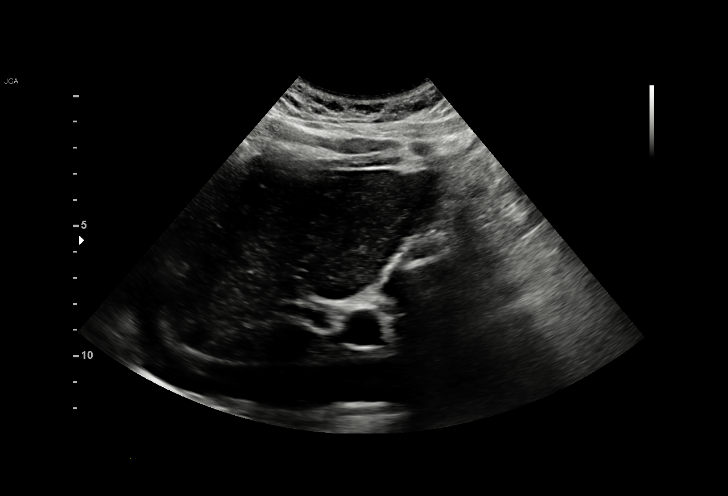
[im 8/35]
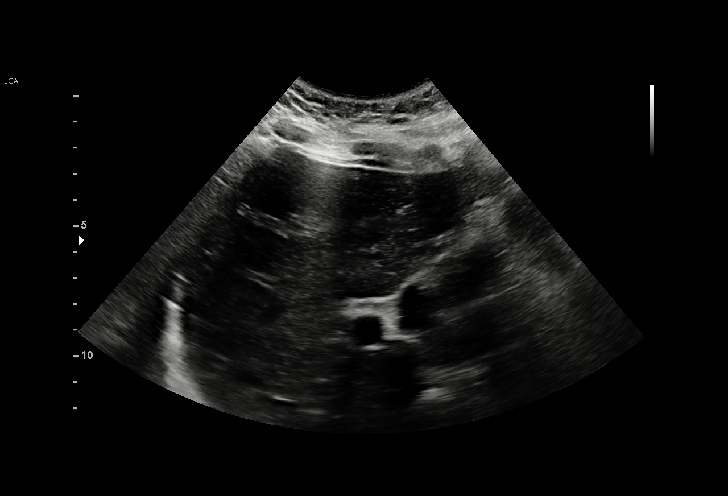
[im 11/35]
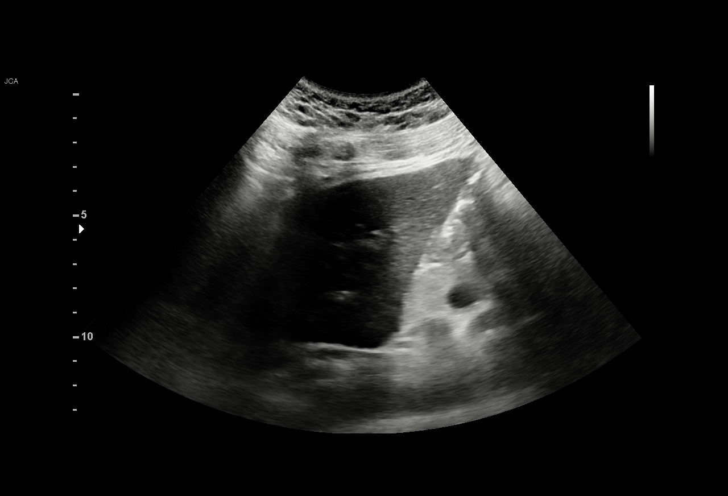
[im 14/35]
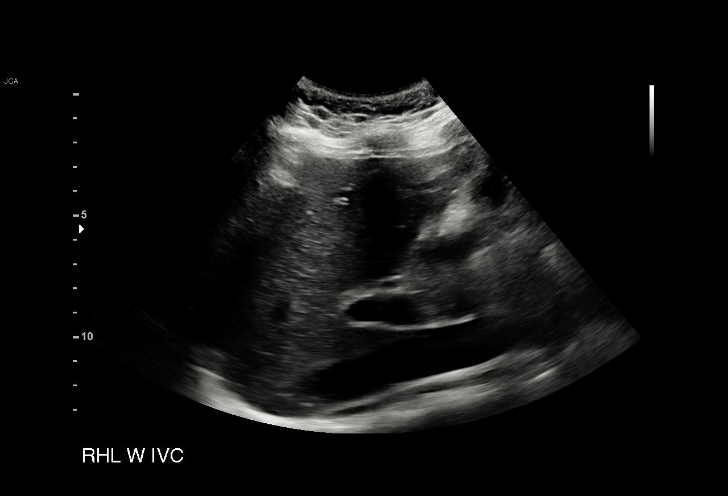
[im 15/35]
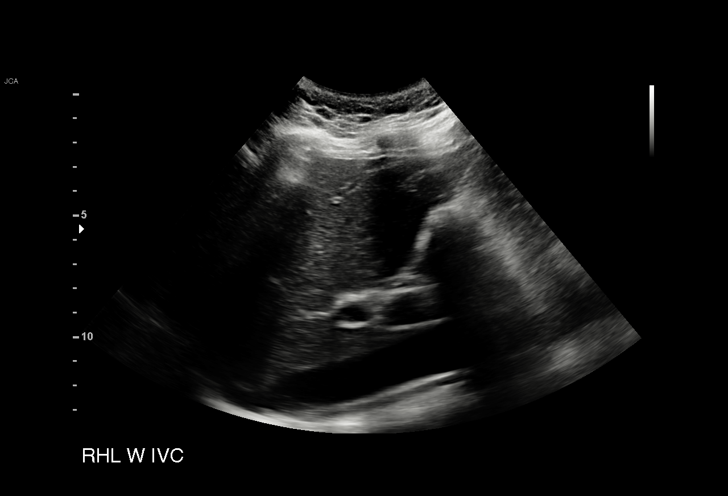
[im 18/35]
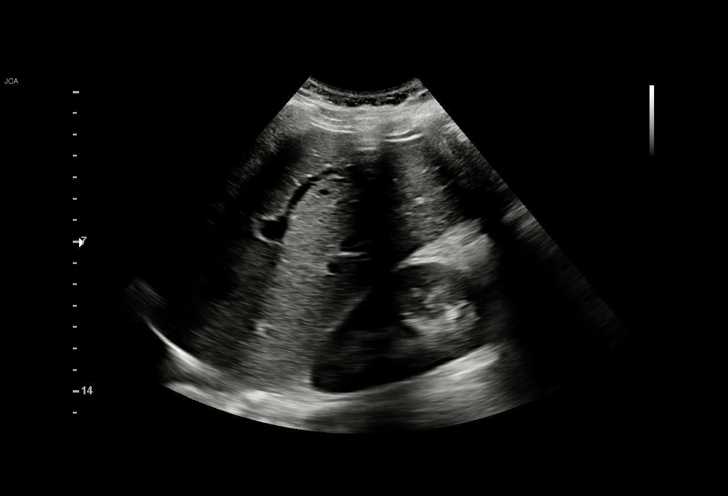
[im 21/35]
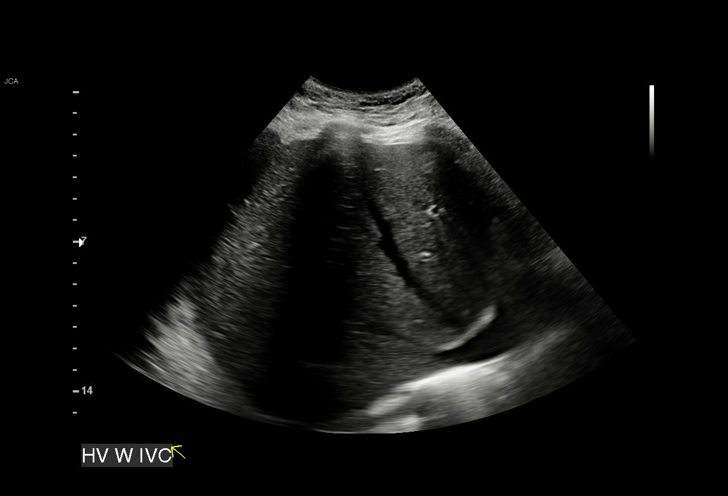
[im 23/35]
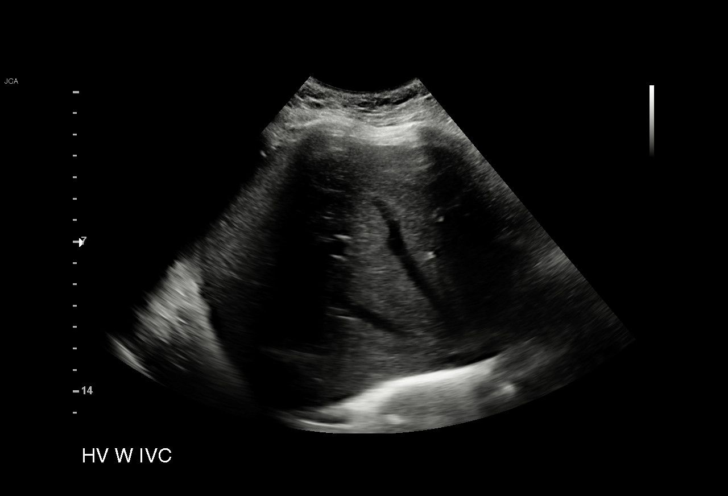
[im 26/35]
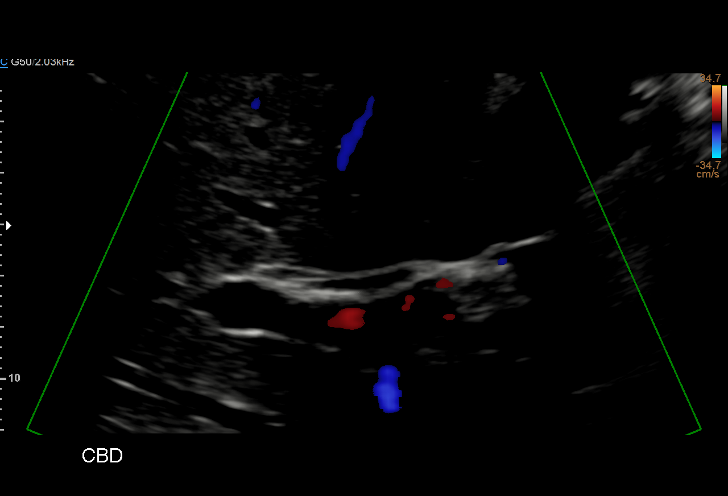
[im 29/35]
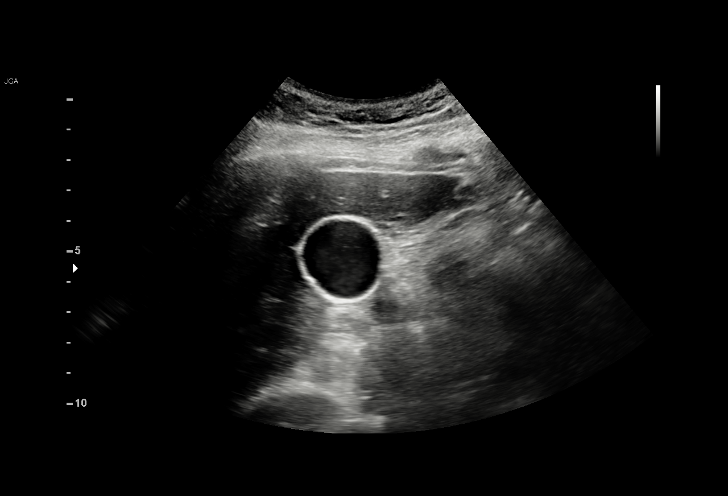
[im 30/35]
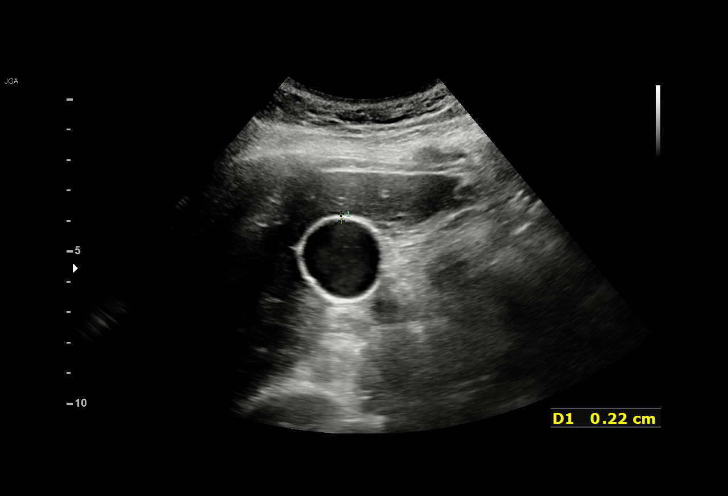
[im 33/35]
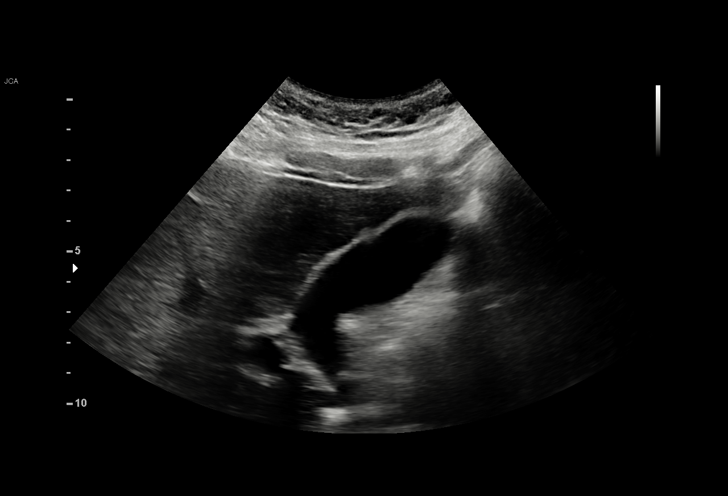

[Series 2: us abdomen limited · 1 of 1 slices shown (2 of 2)]
[im 1/1]
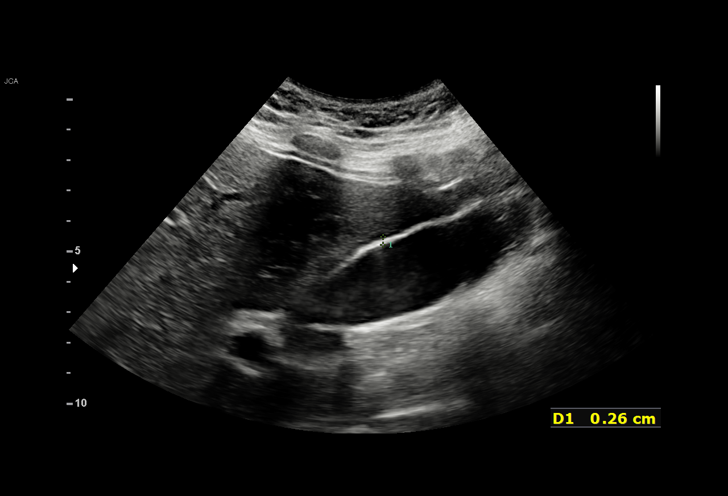

[15 of 25 positions shown; findings below may reference images not displayed]

FINDINGS: Gallbladder:

No gallstones or wall thickening visualized. No sonographic Murphy
sign noted by sonographer.

Common bile duct:

Diameter: Normal diameter, 3 mm

Liver:

No focal lesion identified. Within normal limits in parenchymal
echogenicity. Portal vein is patent on color Doppler imaging with
normal direction of blood flow towards the liver.

Other: None.
IMPRESSION: Unremarkable right upper quadrant ultrasound.

## 2021-07-09 ENCOUNTER — Telehealth: Payer: Self-pay

## 2021-07-09 NOTE — Telephone Encounter (Signed)
Left vm and sent mychart message to confirm 07/11/21 appointment-Toni °

## 2021-07-11 ENCOUNTER — Encounter: Payer: Self-pay | Admitting: Nurse Practitioner

## 2021-07-11 ENCOUNTER — Ambulatory Visit: Payer: 59 | Admitting: Nurse Practitioner

## 2021-07-11 VITALS — BP 120/78 | HR 70 | Temp 98.9°F | Resp 16 | Ht 65.0 in | Wt 190.2 lb

## 2021-07-11 DIAGNOSIS — R053 Chronic cough: Secondary | ICD-10-CM

## 2021-07-11 DIAGNOSIS — E559 Vitamin D deficiency, unspecified: Secondary | ICD-10-CM

## 2021-07-11 DIAGNOSIS — E782 Mixed hyperlipidemia: Secondary | ICD-10-CM | POA: Diagnosis not present

## 2021-07-11 DIAGNOSIS — D509 Iron deficiency anemia, unspecified: Secondary | ICD-10-CM

## 2021-07-11 DIAGNOSIS — Z7689 Persons encountering health services in other specified circumstances: Secondary | ICD-10-CM

## 2021-07-11 NOTE — Progress Notes (Unsigned)
[facility] [address] [place], [place] [address]  Internal MEDICINE  Office Visit Note  Patient Name: Theresa Christensen  [id number]  [id number]  Date of Service: 07/11/2021   Complaints/HPI Pt is here for establishment of PCP. Chief Complaint  Patient presents with   New Patient (Initial Visit)   Gastroesophageal Reflux   Anxiety   HPI Theresa Christensen presents for a new patient visit to establish care. Acid reflux tickle in throat, gets worse at night.  PMH:  Surgeries: Significant FH:  Work:  Home: Diet: Exercise: Substances: Pain: Questions or concerns: Age appropriate screenings: Routine labs Her boyfriend recommended her to come her.    Current Medication: Outpatient Encounter Medications as of 07/11/2021  Medication Sig   famotidine (PEPCID) 20 MG tablet Take 1 tablet (20 mg total) by mouth 2 (two) times daily.   levonorgestrel-ethinyl estradiol (LESSINA-28) 0.1-20 MG-MCG tablet Take 1 tablet by mouth daily. TAKE 1 TABLET BY MOUTH  DAILY   [DISCONTINUED] ibuprofen (ADVIL) 600 MG tablet Take 1 tablet (600 mg total) by mouth every 6 (six) hours as needed. (Patient not taking: Reported on 07/26/2020)   [DISCONTINUED] pantoprazole (PROTONIX) 40 MG tablet TAKE 1 TABLET BY MOUTH EVERY DAY (Patient not taking: Reported on 07/11/2021)   [DISCONTINUED] Prenatal Vit-Fe Fumarate-FA (PRENATAL PO) Take 1 tablet by mouth daily. (Patient not taking: Reported on 07/26/2020)   [DISCONTINUED] SRONYX 0.1-20 MG-MCG tablet Take 1 tablet by mouth daily. (Patient not taking: Reported on 07/11/2021)   No facility-administered encounter medications on file as of 07/11/2021.    Surgical History: Past Surgical History:  Procedure Laterality Date   LAPAROSCOPY     Procedure on Retina     ROBOTIC ASSISTED LAPAROSCOPIC OVARIAN CYSTECTOMY N/A 11/25/2019   Procedure: XI ROBOTIC ASSISTED LAPAROSCOPIC UNILATERAL OVARIAN CYSTECTOMY/UNILATERALOOPORECTOMY;  Surgeon: Adolphus Birchwood, MD;  Location: [facility]  ORS;  Service: Gynecology;  Laterality: N/A;   WISDOM TOOTH EXTRACTION      Medical History: Past Medical History:  Diagnosis Date   Anxiety    Endometriosis    Family history of adverse reaction to anesthesia    mom has nausea   GERD (gastroesophageal reflux disease)    Heart murmur    when younger told had a heart mummur no problems   Migraines     Family History: Family History  Problem Relation Age of Onset   Heart disease Other    Cancer Father        lung   Heart disease Maternal Grandmother    Diabetes Maternal Grandfather    Heart disease Maternal Grandfather    Cancer Maternal Aunt        breast    Social History   Socioeconomic History   Marital status: Single    Spouse name: Not on file   Number of children: Not on file   Years of education: Not on file   Highest education level: Not on file  Occupational History   Not on file  Tobacco Use   Smoking status: Never   Smokeless tobacco: Never  Vaping Use   Vaping Use: Never used  Substance and Sexual Activity   Alcohol use: Not Currently    Alcohol/week: 0.0 standard drinks    Comment: occassional   Drug use: Not Currently   Sexual activity: Yes    Birth control/protection: None    Comment: pregnant  Other Topics Concern   Not on file  Social History Narrative   Not on file   Social Determinants of Health  Financial Resource Strain: Not on file  Food Insecurity: Not on file  Transportation Needs: Not on file  Physical Activity: Not on file  Stress: Not on file  Social Connections: Not on file  Intimate Partner Violence: Not on file     Review of Systems  Vital Signs: BP 120/78    Pulse 70    Temp 98.9 F (37.2 C)    Resp 16    Ht 5\' 5"  (1.651 m)    Wt 190 lb 3.2 oz (86.3 kg)    SpO2 98%    BMI 31.65 kg/m    Physical Exam    Assessment/Plan: There are no diagnoses linked to this encounter.   General Counseling: Altie verbalizes understanding of the findings of todays  visit and agrees with plan of treatment. I have discussed any further diagnostic evaluation that may be needed or ordered today. We also reviewed her medications today. she has been encouraged to call the office with any questions or concerns that should arise related to todays visit.    Counseling:  [place] Controlled Substance Database was reviewed by me.  No orders of the defined types were placed in this encounter.   No orders of the defined types were placed in this encounter.   No follow-ups on file.  Time spent:*** Minutes  This patient was seen by , FNP-C in collaboration with Dr. Sallyanne Kuster as a part of collaborative care agreement.    Abrea Henle R. Beverely Risen, MSN, FNP-C Internal Medicine

## 2021-07-12 ENCOUNTER — Ambulatory Visit
Admission: RE | Admit: 2021-07-12 | Discharge: 2021-07-12 | Disposition: A | Discharge status: Home / Self Care | Payer: Managed Care, Other (non HMO) | Attending: Nurse Practitioner | Admitting: Nurse Practitioner

## 2021-07-12 ENCOUNTER — Ambulatory Visit
Admission: RE | Admit: 2021-07-12 | Discharge: 2021-07-12 | Disposition: A | Discharge status: Home / Self Care | Payer: Managed Care, Other (non HMO) | Source: Ambulatory Visit | Attending: Nurse Practitioner | Admitting: Nurse Practitioner

## 2021-07-12 DIAGNOSIS — R053 Chronic cough: Secondary | ICD-10-CM

## 2021-08-03 LAB — CMP14+EGFR
ALT: 11 IU/L (ref 0–32)
AST: 14 IU/L (ref 0–40)
Albumin/Globulin Ratio: 1.6 (ref 1.2–2.2)
Albumin: 4.4 g/dL (ref 3.8–4.8)
Alkaline Phosphatase: 69 IU/L (ref 44–121)
BUN/Creatinine Ratio: 13 (ref 9–23)
BUN: 9 mg/dL (ref 6–20)
Bilirubin Total: 1 mg/dL (ref 0.0–1.2)
CO2: 22 mmol/L (ref 20–29)
Calcium: 8.9 mg/dL (ref 8.7–10.2)
Chloride: 103 mmol/L (ref 96–106)
Creatinine, Ser: 0.7 mg/dL (ref 0.57–1.00)
Globulin, Total: 2.7 g/dL (ref 1.5–4.5)
Glucose: 81 mg/dL (ref 70–99)
Potassium: 4.6 mmol/L (ref 3.5–5.2)
Sodium: 138 mmol/L (ref 134–144)
Total Protein: 7.1 g/dL (ref 6.0–8.5)
eGFR: 118 mL/min/{1.73_m2} (ref >59)

## 2021-08-03 LAB — CBC WITH DIFFERENTIAL/PLATELET
Basophils Absolute: 0.1 10*3/uL (ref 0.0–0.2)
Basos: 1 %
EOS (ABSOLUTE): 0.2 10*3/uL (ref 0.0–0.4)
Eos: 3 %
Hematocrit: 36.9 % (ref 34.0–46.6)
Hemoglobin: 12.8 g/dL (ref 11.1–15.9)
Immature Grans (Abs): 0 10*3/uL (ref 0.0–0.1)
Immature Granulocytes: 0 %
Lymphocytes Absolute: 0.6 10*3/uL — ABNORMAL LOW (ref 0.7–3.1)
Lymphs: 13 %
MCH: 30.9 pg (ref 26.6–33.0)
MCHC: 34.7 g/dL (ref 31.5–35.7)
MCV: 89 fL (ref 79–97)
Monocytes Absolute: 0.6 10*3/uL (ref 0.1–0.9)
Monocytes: 12 %
Neutrophils Absolute: 3.5 10*3/uL (ref 1.4–7.0)
Neutrophils: 71 %
Platelets: 279 10*3/uL (ref 150–450)
RBC: 4.14 x10E6/uL (ref 3.77–5.28)
RDW: 12.5 % (ref 11.7–15.4)
WBC: 4.9 10*3/uL (ref 3.4–10.8)

## 2021-08-03 LAB — LIPID PANEL
Chol/HDL Ratio: 3.8 ratio (ref 0.0–4.4)
Cholesterol, Total: 143 mg/dL (ref 100–199)
HDL: 38 mg/dL — ABNORMAL LOW (ref >39)
LDL Chol Calc (NIH): 89 mg/dL (ref 0–99)
Triglycerides: 80 mg/dL (ref 0–149)
VLDL Cholesterol Cal: 16 mg/dL (ref 5–40)

## 2021-08-03 LAB — B12 AND FOLATE PANEL
Folate: 6.5 ng/mL (ref >3.0)
Vitamin B-12: 150 pg/mL — ABNORMAL LOW (ref 232–1245)

## 2021-08-03 LAB — IRON,TIBC AND FERRITIN PANEL
Ferritin: 59 ng/mL (ref 15–150)
Iron Saturation: 27 % (ref 15–55)
Iron: 78 ug/dL (ref 27–159)
Total Iron Binding Capacity: 285 ug/dL (ref 250–450)
UIBC: 207 ug/dL (ref 131–425)

## 2021-08-03 LAB — VITAMIN D 25 HYDROXY (VIT D DEFICIENCY, FRACTURES): Vit D, 25-Hydroxy: 31 ng/mL (ref 30.0–100.0)

## 2021-08-03 LAB — TSH+FREE T4
Free T4: 1.2 ng/dL (ref 0.82–1.77)
TSH: 2.3 u[IU]/mL (ref 0.450–4.500)

## 2021-08-15 ENCOUNTER — Encounter: Payer: Self-pay | Admitting: Nurse Practitioner

## 2021-08-17 ENCOUNTER — Telehealth: Payer: Self-pay

## 2021-08-17 NOTE — Telephone Encounter (Signed)
-----   Message from Jonetta Osgood, NP sent at 08/10/2021  2:17 PM EDT ----- ?Please call patient and let her know her lab results: ?-- Her CBC is grossly normal, no anemia noted ?--Her cholesterol levels are grossly normal except for a slightly decreased HDL of 38, encourage increased intake of lean proteins such as chicken [place] and fish and may add an over-the-counter fish oil supplement of 1000 mg daily ?--Her B12 level is significantly low, patient will need weekly B12 injections x3 weeks and then monthly B12 injections x5 months and then we can repeat the B12 level and see if it is improved ?--Vitamin D level is borderline low at 31.0, I recommend patient takes an over-the-counter vitamin D supplement of 5000 units daily if she is not already on a supplement. ?--Thyroid levels are normal ?--Iron panel and ferritin are normal ?--Metabolic panel is normal, kidney function and liver function are normal. ?

## 2021-08-22 ENCOUNTER — Encounter: Payer: 59 | Admitting: Nurse Practitioner

## 2021-08-31 ENCOUNTER — Encounter: Payer: Self-pay | Admitting: Family

## 2021-08-31 ENCOUNTER — Ambulatory Visit (INDEPENDENT_AMBULATORY_CARE_PROVIDER_SITE_OTHER): Payer: Managed Care, Other (non HMO) | Admitting: Family

## 2021-08-31 VITALS — BP 118/64 | HR 76 | Temp 99.7°F | Resp 16 | Ht 65.0 in | Wt 185.4 lb

## 2021-08-31 DIAGNOSIS — E559 Vitamin D deficiency, unspecified: Secondary | ICD-10-CM

## 2021-08-31 DIAGNOSIS — J301 Allergic rhinitis due to pollen: Secondary | ICD-10-CM | POA: Insufficient documentation

## 2021-08-31 DIAGNOSIS — F411 Generalized anxiety disorder: Secondary | ICD-10-CM

## 2021-08-31 DIAGNOSIS — D519 Vitamin B12 deficiency anemia, unspecified: Secondary | ICD-10-CM | POA: Insufficient documentation

## 2021-08-31 DIAGNOSIS — N809 Endometriosis, unspecified: Secondary | ICD-10-CM | POA: Diagnosis not present

## 2021-08-31 MED ORDER — AZELASTINE HCL 0.1 % NA SOLN: NASAL | 0 refills | Status: DC

## 2021-08-31 MED ORDER — CYANOCOBALAMIN 1000 MCG/ML IJ SOLN
1000.0000 ug | Freq: Once | INTRAMUSCULAR | Status: AC
  Administered 2021-08-31: 1000 ug via INTRAMUSCULAR

## 2021-08-31 NOTE — Assessment & Plan Note (Signed)
rx for azelastine 0.1% spray ?Otherwise generic antihistamine ok  ?flonase recommended daily as well ?

## 2021-08-31 NOTE — Progress Notes (Signed)
? ?New Patient Office Visit ? ?Subjective:  ?Patient ID: Theresa Christensen, female    DOB: Sep 19, 1990  Age: 31 y.o. MRN: [id number] ? ?CC:  ?Chief Complaint  ?Patient presents with  ? Establish Care  ? ? ?HPI ?Theresa Christensen is here to establish care as a new patient. ? ?Prior provider was: Jonetta Osgood, NP  ?Pt is without acute concerns.  ? ?chronic concerns: ? ?B12 def: was taking 1000 mcg once daily however weird smell sensation so decreased to 500 mcg and has since improved with weird smell. Does have increased fatigue. ? Did take two pregnancy tests and both negative. ? LMP was march 31st.  ? ?Lab Results  ?Component Value Date  ? VITAMINB12 150 (L) 08/02/2021  ? ?Vitamin d def:  not currently taking this.  ? ?Last vitamin D ?Lab Results  ?Component Value Date  ? VD25OH 31.0 08/02/2021  ? ?GAD: stable per pt.  ? ?H/o bil retinal detachment: sees opthamologist once yearly ? ?Pap- 07/2021 negative per pt, gyn Dr. Cristal Generous ? ?Past Medical History:  ?Diagnosis Date  ? Anxiety   ? Endometriosis   ? Family history of adverse reaction to anesthesia   ? mom has nausea  ? GERD (gastroesophageal reflux disease)   ? Heart murmur   ? when younger told had a heart mummur no problems  ? Migraines   ? Pneumonia due to COVID-19 virus 04/03/2020  ? ? ?Past Surgical History:  ?Procedure Laterality Date  ? LAPAROSCOPY    ? for endometriosis  ? ovarian cyst Right   ? Procedure on Retina Bilateral   ? hereditary, had to use scleral bucket right and left lasix  ? ROBOTIC ASSISTED LAPAROSCOPIC OVARIAN CYSTECTOMY N/A 11/25/2019  ? Procedure: XI ROBOTIC ASSISTED LAPAROSCOPIC UNILATERAL OVARIAN CYSTECTOMY/UNILATERALOOPORECTOMY;  Surgeon: Everitt Amber, MD;  Location: [facility];  Service: Gynecology;  Laterality: N/A;  ? WISDOM TOOTH EXTRACTION    ? ? ?Family History  ?Problem Relation Age of Onset  ? Retinal detachment Mother   ? Lung cancer Father   ?     died early 57's  ? Alcohol abuse Father   ? Heart disease Maternal  Grandmother   ? Diabetes Maternal Grandfather   ? Heart disease Maternal Grandfather   ? Breast cancer Maternal Aunt   ?     85s  ? Heart disease Other   ? ? ?Social History  ? ?Socioeconomic History  ? Marital status: Significant Other  ?  Spouse name: Not on file  ? Number of children: 1  ? Years of education: Not on file  ? Highest education level: Not on file  ?Occupational History  ? Not on file  ?Tobacco Use  ? Smoking status: Never  ? Smokeless tobacco: Never  ?Vaping Use  ? Vaping Use: Never used  ?Substance and Sexual Activity  ? Alcohol use: Not Currently  ?  Alcohol/week: 0.0 standard drinks  ?  Comment: occassional  ? Drug use: Not Currently  ? Sexual activity: Yes  ?  Partners: Male  ?  Birth control/protection: Pill  ?Other Topics Concern  ? Not on file  ?Social History Narrative  ? 16 month girl   ? ?Social Determinants of Health  ? ?Financial Resource Strain: Not on file  ?Food Insecurity: Not on file  ?Transportation Needs: Not on file  ?Physical Activity: Not on file  ?Stress: Not on file  ?Social Connections: Not on file  ?Intimate Partner Violence: Not on file  ? ? ?Outpatient  Medications Prior to Visit  ?Medication Sig Dispense Refill  ? levonorgestrel-ethinyl estradiol (ALESSE) 0.1-20 MG-MCG tablet Take 1 tablet by mouth daily. TAKE 1 TABLET BY MOUTH  DAILY    ? vitamin B-12 (CYANOCOBALAMIN) 500 MCG tablet Take 500 mcg by mouth daily.    ? famotidine (PEPCID) 20 MG tablet Take 1 tablet (20 mg total) by mouth 2 (two) times daily. 90 tablet 3  ? ?No facility-administered medications prior to visit.  ? ? ?Allergies  ?Allergen Reactions  ? Penicillins Itching  ?  Rash with minor swelling (CHILDHOOD REACTION)  ? ? ?ROS ?Review of Systems  ?Constitutional:  Positive for fatigue. Negative for chills, fever and unexpected weight change.  ?Eyes:  Negative for visual disturbance.  ?Respiratory:  Negative for shortness of breath.   ?Cardiovascular:  Negative for chest pain.  ?Gastrointestinal:  Negative  for abdominal pain.  ?Genitourinary:  Negative for difficulty urinating.  ?Skin:  Negative for rash.  ?Neurological:  Negative for dizziness and headaches.  ? ? ? ?  ?Objective:  ?  ?Physical Exam ?Vitals reviewed.  ?Constitutional:   ?   General: She is not in acute distress. ?   Appearance: Normal appearance. She is not ill-appearing or toxic-appearing.  ?HENT:  ?   Right Ear: Tympanic membrane normal.  ?   Left Ear: Tympanic membrane normal.  ?   Mouth/Throat:  ?   Mouth: Mucous membranes are moist.  ?   Pharynx: Posterior oropharyngeal erythema present. No pharyngeal swelling.  ?   Tonsils: No tonsillar exudate.  ?Eyes:  ?   Extraocular Movements: Extraocular movements intact.  ?   Conjunctiva/sclera: Conjunctivae normal.  ?   Pupils: Pupils are equal, round, and reactive to light.  ?Neck:  ?   Thyroid: No thyroid mass.  ?Cardiovascular:  ?   Rate and Rhythm: Normal rate and regular rhythm.  ?Pulmonary:  ?   Effort: Pulmonary effort is normal.  ?   Breath sounds: Normal breath sounds.  ?Musculoskeletal:     ?   General: Normal range of motion.  ?Lymphadenopathy:  ?   Cervical:  ?   Right cervical: No superficial cervical adenopathy. ?   Left cervical: No superficial cervical adenopathy.  ?Skin: ?   General: Skin is warm.  ?   Capillary Refill: Capillary refill takes less than 2 seconds.  ?Neurological:  ?   General: No focal deficit present.  ?   Mental Status: She is alert and oriented to person, place, and time.  ?Psychiatric:     ?   Mood and Affect: Mood normal.     ?   Behavior: Behavior normal.     ?   Thought Content: Thought content normal.     ?   Judgment: Judgment normal.  ? ? ?Gen: NAD, resting comfortably ?CV: RRR with no murmurs appreciated ?Pulm: NWOB, CTAB with no crackles, wheezes, or rhonchi ?Skin: warm, dry ?Psych: Normal affect and thought content ? ?BP 118/64   Pulse 76   Temp 99.7 ?F (37.6 ?C)   Resp 16   Ht 5' 5"  (1.651 m)   Wt 185 lb 7 oz (84.1 kg)   SpO2 98%   BMI 30.86 kg/m?  ?Wt  Readings from Last 3 Encounters:  ?08/31/21 185 lb 7 oz (84.1 kg)  ?07/11/21 190 lb 3.2 oz (86.3 kg)  ?07/26/20 190 lb (86.2 kg)  ? ? ? ?There are no preventive care reminders to display for this patient. ? ? ?There are no preventive  care reminders to display for this patient. ? ?Lab Results  ?Component Value Date  ? TSH 2.300 08/02/2021  ? ?Lab Results  ?Component Value Date  ? WBC 4.9 08/02/2021  ? HGB 12.8 08/02/2021  ? HCT 36.9 08/02/2021  ? MCV 89 08/02/2021  ? PLT 279 08/02/2021  ? ?Lab Results  ?Component Value Date  ? NA 138 08/02/2021  ? K 4.6 08/02/2021  ? CO2 22 08/02/2021  ? GLUCOSE 81 08/02/2021  ? BUN 9 08/02/2021  ? CREATININE 0.70 08/02/2021  ? BILITOT 1.0 08/02/2021  ? ALKPHOS 69 08/02/2021  ? AST 14 08/02/2021  ? ALT 11 08/02/2021  ? PROT 7.1 08/02/2021  ? ALBUMIN 4.4 08/02/2021  ? CALCIUM 8.9 08/02/2021  ? ANIONGAP 7 04/07/2020  ? EGFR 118 08/02/2021  ? ?Lab Results  ?Component Value Date  ? CHOL 143 08/02/2021  ? ?Lab Results  ?Component Value Date  ? HDL 38 (L) 08/02/2021  ? ?Lab Results  ?Component Value Date  ? [place] 89 08/02/2021  ? ?Lab Results  ?Component Value Date  ? TRIG 80 08/02/2021  ? ?Lab Results  ?Component Value Date  ? CHOLHDL 3.8 08/02/2021  ? ?Lab Results  ?Component Value Date  ? HGBA1C 4.70 12/30/2017  ? ? ?  ?Assessment & Plan:  ? ?Problem List Items Addressed This Visit   ? ?  ? Respiratory  ? Seasonal allergic rhinitis due to pollen  ?  rx for azelastine 0.1% spray ?Otherwise generic antihistamine ok  ?flonase recommended daily as well ?  ?  ? Relevant Medications  ? azelastine (ASTELIN) 0.1 % nasal spray  ?  ? Other  ? GAD (generalized anxiety disorder)  ?  stable ?  ?  ? Anemia due to vitamin B12 deficiency - Primary  ?  b12 injection administered in office today, verbal consent obtained prior to administration. Pt tolerated procedure well.  ?continue 500 mcg vitamin B12 , will repeat b12 in three months.  ? ?  ?  ? Relevant Medications  ? vitamin B-12 (CYANOCOBALAMIN)  500 MCG tablet  ? Other Relevant Orders  ? B12 and Folate Panel  ? Vitamin D deficiency  ?  Recommend start vitamin D3 1000 IU once daily.  ?Will repeat in three months as well.  ?  ?  ? Relevant Orders  ? VITAMIN D

## 2021-08-31 NOTE — Assessment & Plan Note (Signed)
b12 injection administered in office today, verbal consent obtained prior to administration. Pt tolerated procedure well.  ?continue 500 mcg vitamin B12 , will repeat b12 in three months.  ? ?

## 2021-08-31 NOTE — Addendum Note (Signed)
Addended by: Vertis Kelch on: 08/31/2021 03:10 PM ? ? Modules accepted: Orders ? ?

## 2021-08-31 NOTE — Patient Instructions (Addendum)
Recommend over the counter Vitamin D3 1000 IU once daily over the counter.   ? ?Repeat labs in three months for b12 and vitamin D.  ? ?If nose spray not covered by pharmacy consider zyrtec (generic cetirizine) and or Xyzal (levo-cetirizine). These two MAY make you tired.  ? ?Welcome to our clinic, I am happy to have you as my new patient. I am excited to continue on this healthcare journey with you. ? ?Please keep in mind ?Any my chart messages you send have p to a three business day turnaround for a response.  ?Phone calls may have up to a one day business turnaround for a  response.  ? ?If you need a medication refill I recommend you request it through the pharmacy as this is easiest for [place] rather than sending a message and or phone call.  ? ? ?Due to recent changes in healthcare laws, you may see results of your imaging and/or laboratory studies on MyChart before I have had a chance to review them.  I understand that in some cases there may be results that are confusing or concerning to you. Please understand that not all results are received at the same time and often I may need to interpret multiple results in order to provide you with the best plan of care or course of treatment. Therefore, I ask that you please give me 2 business days to thoroughly review all your results before contacting my office for clarification. Should we see a critical lab result, you will be contacted sooner.  ? ?It was a pleasure seeing you today! Please do not hesitate to reach out with any questions and or concerns. ? ?Regards,  ? ?Genoveva Singleton ?FNP-C ? ?

## 2021-08-31 NOTE — Assessment & Plan Note (Addendum)
Recommend start vitamin D3 1000 IU once daily.  ?Will repeat in three months as well.  ?

## 2021-08-31 NOTE — Assessment & Plan Note (Signed)
stable °

## 2021-08-31 NOTE — Assessment & Plan Note (Signed)
Continue OCPs ?Continue f/u with gyn as scheduled ?

## 2021-10-02 ENCOUNTER — Ambulatory Visit (INDEPENDENT_AMBULATORY_CARE_PROVIDER_SITE_OTHER): Payer: Managed Care, Other (non HMO)

## 2021-10-02 DIAGNOSIS — D519 Vitamin B12 deficiency anemia, unspecified: Secondary | ICD-10-CM

## 2021-10-02 MED ORDER — CYANOCOBALAMIN 1000 MCG/ML IJ SOLN
1000.0000 ug | Freq: Once | INTRAMUSCULAR | Status: AC
  Administered 2021-10-02: 1000 ug via INTRAMUSCULAR

## 2021-10-02 NOTE — Progress Notes (Signed)
Per orders of Mort Sawyers, NP, #1 of 3 weekly B12 1000 mcg/ml injection in Right Deltoid given by Eual Fines, CMA. ?Patient tolerated injection well. ? ?I told her what I had discussed about the B12 orders. She agreed to one b12 for two more weeklys then once a month for three months.  ?

## 2021-10-09 ENCOUNTER — Telehealth: Payer: Self-pay | Admitting: *Deleted

## 2021-10-09 ENCOUNTER — Ambulatory Visit: Payer: Managed Care, Other (non HMO)

## 2021-10-09 NOTE — Telephone Encounter (Signed)
Patient is scheduled for a nurse visit today for a B-12. Patient had a B-12 injection 08/31/21 and then 10/02/21.  After talking with Mort Sawyers NP patient was called and advised that it is okay for her to take the B-12 shots once monthly, but needs to be taking B-12 1000 mcg daily. Patient stated that she thought that was the plan but when she came last week she was told by the person that gave her the B-12 shot that she needed 3 once a week for 3 weeks then monthly. Patient stated that she is taking B-12 1000 mcg daily. Patient stated that she is okay with taking the B-12 injection monthly. Patient stated that she is already scheduled for just a lab appointment November 30, 2021.

## 2021-10-09 NOTE — Telephone Encounter (Signed)
Patient notified as instructed by telephone per Mort Sawyers NP's note. Patient scheduled for a nurse visit for a B-12 injection 11/14/21. Patient declined an earlier appointment because of other doctor appointments for her child. Patient scheduled for an appointment wit Mort Sawyers NP 12/04/21 at 3:20 pm.

## 2021-11-14 ENCOUNTER — Ambulatory Visit: Payer: Managed Care, Other (non HMO)

## 2021-11-30 ENCOUNTER — Other Ambulatory Visit: Payer: Managed Care, Other (non HMO)

## 2021-12-04 ENCOUNTER — Encounter: Payer: Self-pay | Admitting: Family

## 2021-12-04 ENCOUNTER — Ambulatory Visit (INDEPENDENT_AMBULATORY_CARE_PROVIDER_SITE_OTHER): Payer: Managed Care, Other (non HMO) | Admitting: Family

## 2021-12-04 VITALS — BP 112/62 | HR 61 | Temp 98.6°F | Resp 16 | Ht 65.0 in | Wt 184.1 lb

## 2021-12-04 DIAGNOSIS — E559 Vitamin D deficiency, unspecified: Secondary | ICD-10-CM | POA: Diagnosis not present

## 2021-12-04 DIAGNOSIS — J301 Allergic rhinitis due to pollen: Secondary | ICD-10-CM

## 2021-12-04 DIAGNOSIS — F411 Generalized anxiety disorder: Secondary | ICD-10-CM

## 2021-12-04 DIAGNOSIS — D519 Vitamin B12 deficiency anemia, unspecified: Secondary | ICD-10-CM | POA: Diagnosis not present

## 2021-12-04 MED ORDER — CYANOCOBALAMIN 1000 MCG/ML IJ SOLN
1000.0000 ug | Freq: Once | INTRAMUSCULAR | Status: AC
  Administered 2021-12-04: 1000 ug via INTRAMUSCULAR

## 2021-12-04 MED ORDER — FLUTICASONE PROPIONATE 50 MCG/ACT NA SUSP: 2.0000 | Freq: Every day | NASAL | 2 refills | Status: DC

## 2021-12-04 NOTE — Assessment & Plan Note (Signed)
Continue astelin  rx sent for flonase 50 mcg once daily

## 2021-12-04 NOTE — Assessment & Plan Note (Signed)
Work on anxiety reducing techniques and complementary therapies prn

## 2021-12-04 NOTE — Assessment & Plan Note (Signed)
Continue daily otc vitamin d 1000

## 2021-12-04 NOTE — Progress Notes (Signed)
Established Patient Office Visit  Subjective:  Patient ID: Theresa Christensen, female    DOB: [birth year]  Age: 31 y.o. MRN: [id number]  CC:  Chief Complaint  Patient presents with   Anemia    HPI Theresa Christensen is here today for follow up.   Vitamin b12 def: was able to get 2 out of three b12 injections. Couldn't get third because she had her little girl get sick at home. Is also taking otc 1000 mcg once daily.  Lab Results  Component Value Date   VITAMINB12 150 (L) 08/02/2021   Allergies: started astelin before bed which is really helping. Not taking flonase.   Vitamin d def: taking 1000 IU once daily.   Past Medical History:  Diagnosis Date   Anxiety    Endometriosis    Family history of adverse reaction to anesthesia    mom has nausea   GERD (gastroesophageal reflux disease)    Heart murmur    when younger told had a heart mummur no problems   Migraines    Pneumonia due to COVID-19 virus 04/03/2020    Past Surgical History:  Procedure Laterality Date   LAPAROSCOPY     for endometriosis   ovarian cyst Right    Procedure on Retina Bilateral    hereditary, had to use scleral bucket right and left lasix   ROBOTIC ASSISTED LAPAROSCOPIC OVARIAN CYSTECTOMY N/A 11/25/2019   Procedure: XI ROBOTIC ASSISTED LAPAROSCOPIC UNILATERAL OVARIAN CYSTECTOMY/UNILATERALOOPORECTOMY;  Surgeon: Everitt Amber, MD;  Location: [facility];  Service: Gynecology;  Laterality: N/A;   WISDOM TOOTH EXTRACTION      Family History  Problem Relation Age of Onset   Retinal detachment Mother    Lung cancer Father        died early 70's   Alcohol abuse Father    Heart disease Maternal Grandmother    Diabetes Maternal Grandfather    Heart disease Maternal Grandfather    Breast cancer Maternal Aunt        60s   Heart disease Other     Social History   Socioeconomic History   Marital status: Significant Other    Spouse name: Not on file   Number of children: 1   Years of education: Not on  file   Highest education level: Not on file  Occupational History   Not on file  Tobacco Use   Smoking status: Never   Smokeless tobacco: Never  Vaping Use   Vaping Use: Never used  Substance and Sexual Activity   Alcohol use: Not Currently    Alcohol/week: 0.0 standard drinks of alcohol    Comment: occassional   Drug use: Not Currently   Sexual activity: Yes    Partners: Male    Birth control/protection: Pill  Other Topics Concern   Not on file  Social History Narrative   16 month girl    Social Determinants of Radio broadcast assistant Strain: Not on file  Food Insecurity: Not on file  Transportation Needs: Not on file  Physical Activity: Not on file  Stress: Not on file  Social Connections: Not on file  Intimate Partner Violence: Not on file    Outpatient Medications Prior to Visit  Medication Sig Dispense Refill   azelastine (ASTELIN) 0.1 % nasal spray Instill 2 sprays bil nares QAM 18 mL 0   Cholecalciferol (D3-1000) 25 MCG (1000 UT) capsule Take 1,000 Units by mouth daily.     levonorgestrel-ethinyl estradiol (ALESSE) 0.1-20 MG-MCG tablet Take 1 tablet  by mouth daily. TAKE 1 TABLET BY MOUTH  DAILY     vitamin B-12 (CYANOCOBALAMIN) 500 MCG tablet Take 500 mcg by mouth daily.     No facility-administered medications prior to visit.    Allergies  Allergen Reactions   Penicillins Itching    Rash with minor swelling (CHILDHOOD REACTION)          Objective:    Physical Exam Constitutional:      General: She is not in acute distress.    Appearance: Normal appearance. She is obese. She is not ill-appearing, toxic-appearing or diaphoretic.  HENT:     Mouth/Throat:     Pharynx: Posterior oropharyngeal erythema present.  Pulmonary:     Effort: Pulmonary effort is normal.  Neurological:     General: No focal deficit present.     Mental Status: She is alert and oriented to person, place, and time. Mental status is at baseline.  Psychiatric:        Mood  and Affect: Mood normal.        Behavior: Behavior normal.        Thought Content: Thought content normal.        Judgment: Judgment normal.     BP 112/62   Pulse 61   Temp 98.6 F (37 C)   Resp 16   Ht 5' 5"  (1.651 m)   Wt 184 lb 2 oz (83.5 kg)   SpO2 98%   Breastfeeding No   BMI 30.64 kg/m  Wt Readings from Last 3 Encounters:  12/04/21 184 lb 2 oz (83.5 kg)  08/31/21 185 lb 7 oz (84.1 kg)  07/11/21 190 lb 3.2 oz (86.3 kg)     There are no preventive care reminders to display for this patient.  There are no preventive care reminders to display for this patient.  Lab Results  Component Value Date   TSH 2.300 08/02/2021   Lab Results  Component Value Date   WBC 4.9 08/02/2021   HGB 12.8 08/02/2021   HCT 36.9 08/02/2021   MCV 89 08/02/2021   PLT 279 08/02/2021   Lab Results  Component Value Date   NA 138 08/02/2021   K 4.6 08/02/2021   CO2 22 08/02/2021   GLUCOSE 81 08/02/2021   BUN 9 08/02/2021   CREATININE 0.70 08/02/2021   BILITOT 1.0 08/02/2021   ALKPHOS 69 08/02/2021   AST 14 08/02/2021   ALT 11 08/02/2021   PROT 7.1 08/02/2021   ALBUMIN 4.4 08/02/2021   CALCIUM 8.9 08/02/2021   ANIONGAP 7 04/07/2020   EGFR 118 08/02/2021   Lab Results  Component Value Date   CHOL 143 08/02/2021   Lab Results  Component Value Date   HDL 38 (L) 08/02/2021   Lab Results  Component Value Date   LDLCALC 89 08/02/2021   Lab Results  Component Value Date   TRIG 80 08/02/2021   Lab Results  Component Value Date   CHOLHDL 3.8 08/02/2021   Lab Results  Component Value Date   HGBA1C 4.70 12/30/2017      Assessment & Plan:   Problem List Items Addressed This Visit       Respiratory   Seasonal allergic rhinitis due to pollen    Continue astelin  rx sent for flonase 50 mcg once daily       Relevant Medications   fluticasone (FLONASE) 50 MCG/ACT nasal spray     Other   GAD (generalized anxiety disorder)    Work on anxiety reducing  techniques  and complementary therapies prn      Anemia due to vitamin B12 deficiency - Primary    Recheck b12 labs  After b12 labwork drawn today give b12 injection, and this was tolerated well administered in office Pt tolerated procedure well  Verbal consent obtained prior to administration        Relevant Orders   Vitamin B12   Vitamin D deficiency    Continue daily otc vitamin d 1000       Relevant Orders   VITAMIN D 25 Hydroxy (Vit-D Deficiency, Fractures)    Meds ordered this encounter  Medications   fluticasone (FLONASE) 50 MCG/ACT nasal spray    Sig: Place 2 sprays into both nostrils daily.    Dispense:  16 g    Refill:  2    Order Specific Question:   Supervising Provider    Answer:   BEDSOLE, AMY E [[id number]]   cyanocobalamin ((VITAMIN B-12)) injection 1,000 mcg    Follow-up: Return in about 1 year (around 12/05/2022) for regular follow up appt, annual .    Eugenia Pancoast, FNP

## 2021-12-04 NOTE — Assessment & Plan Note (Signed)
Recheck b12 labs  After b12 labwork drawn today give b12 injection, and this was tolerated well administered in office Pt tolerated procedure well  Verbal consent obtained prior to administration

## 2021-12-04 NOTE — Patient Instructions (Signed)
Due to recent changes in healthcare laws, you may see results of your imaging and/or laboratory studies on MyChart before I have had a chance to review them.  I understand that in some cases there may be results that are confusing or concerning to you. Please understand that not all results are received at the same time and often I may need to interpret multiple results in order to provide you with the best plan of care or course of treatment. Therefore, I ask that you please give me 2 business days to thoroughly review all your results before contacting my office for clarification. Should we see a critical lab result, you will be contacted sooner.   It was a pleasure seeing you today! Please do not hesitate to reach out with any questions and or concerns.  Regards,   Evelina Lore FNP-C  

## 2021-12-05 LAB — VITAMIN B12: Vitamin B-12: 541 pg/mL (ref 232–1245)

## 2021-12-05 LAB — VITAMIN D 25 HYDROXY (VIT D DEFICIENCY, FRACTURES): Vit D, 25-Hydroxy: 51.9 ng/mL (ref 30.0–100.0)

## 2022-04-07 IMAGING — CR DG CHEST 2V
1 series · 2 of 2 positions shown · non-contrast
Comparison: Chest radiograph 04/03/2020.

CLINICAL DATA: Persistent cough.  History of [id number].

EXAM:
CHEST - 2 VIEW

[Series 1: dg chest 2 view · 0.14mm/px · 2 of 2 slices shown]
[im 1/2]
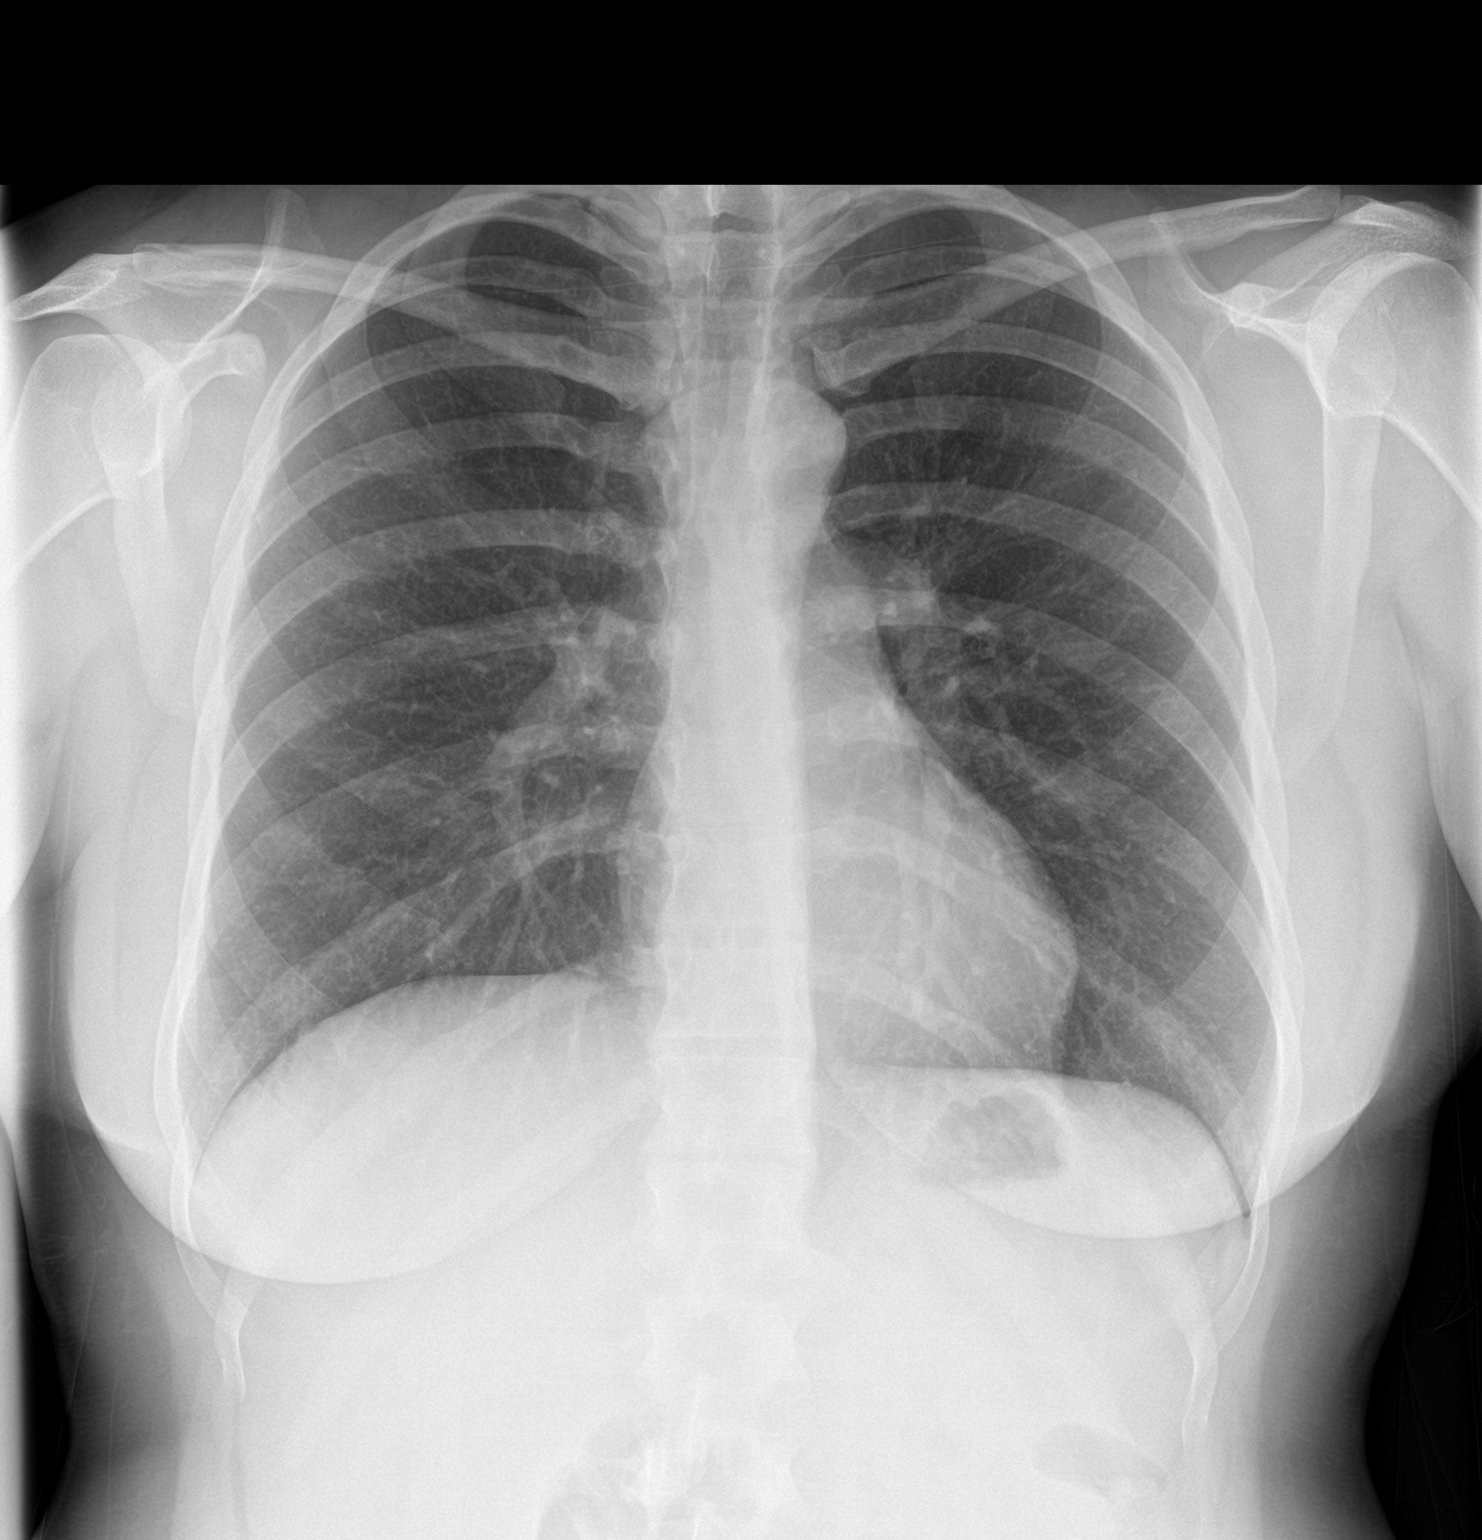
[im 2/2]
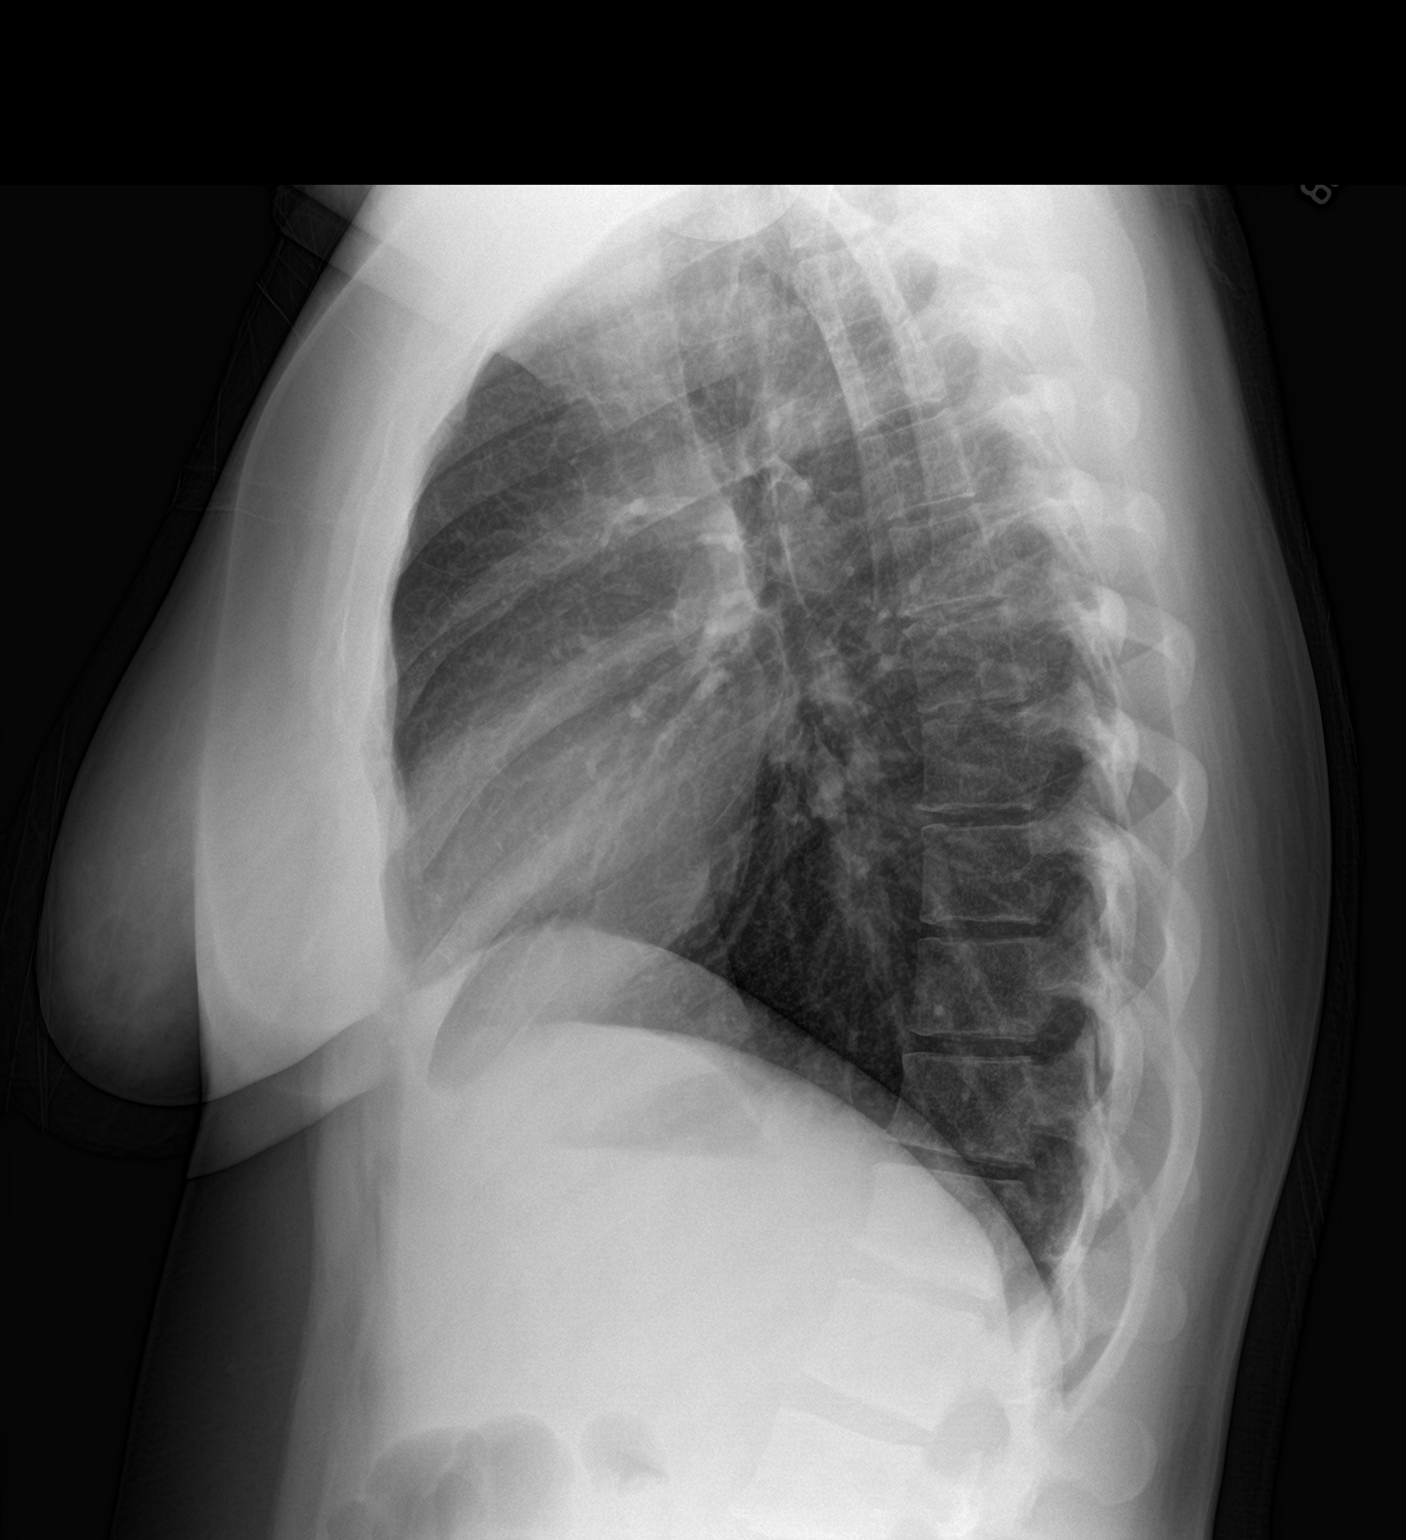

[2 of 2 positions shown; findings below may reference images not displayed]

FINDINGS: Stable cardiac and mediastinal contours. No large area pulmonary
consolidation. Interval resolution previously described areas of
consolidation. No pleural effusion or pneumothorax. Osseous
structures unremarkable.
IMPRESSION: No active cardiopulmonary disease.

## 2022-05-20 NOTE — L&D Delivery Note (Signed)
Delivery Note At 12:40 AM a viable female was delivered via Vaginal, Spontaneous (Presentation:   OA   ).  APGAR: pending ; weight pending .   Placenta status: Spontaneous, Intact.  Cord: 3 vessels with the following complications:  .  Cord pH: not sent  Anesthesia: Epidural Episiotomy:  None Lacerations:  2nd degree Suture Repair: 2.0 3.0 vicryl Est. Blood Loss (mL):  404cc   It's a boy - "Myles"!!   Mom to postpartum.  Baby to Couplet care / Skin to Skin.  Madelaine Etienne Marnisha Stampley 02/19/2023, 1:00 AM

## 2022-07-17 LAB — OB RESULTS CONSOLE RUBELLA ANTIBODY, IGM: Rubella: IMMUNE

## 2022-07-22 LAB — OB RESULTS CONSOLE HEPATITIS B SURFACE ANTIGEN: Hepatitis B Surface Ag: NEGATIVE

## 2022-08-01 LAB — OB RESULTS CONSOLE GC/CHLAMYDIA
Chlamydia: NEGATIVE
Neisseria Gonorrhea: NEGATIVE

## 2022-08-13 ENCOUNTER — Encounter: Payer: Managed Care, Other (non HMO) | Admitting: Obstetrics & Gynecology

## 2022-08-15 LAB — OB RESULTS CONSOLE ABO/RH: RH Type: POSITIVE

## 2022-12-03 LAB — OB RESULTS CONSOLE HIV ANTIBODY (ROUTINE TESTING): HIV: NONREACTIVE

## 2023-02-03 LAB — OB RESULTS CONSOLE GBS: GBS: NEGATIVE

## 2023-02-18 ENCOUNTER — Inpatient Hospital Stay (HOSPITAL_COMMUNITY)
Admission: AD | Admit: 2023-02-18 | Discharge: 2023-02-20 | DRG: 807 | Disposition: A | Discharge status: Home / Self Care | Payer: Managed Care, Other (non HMO) | Attending: Obstetrics and Gynecology | Admitting: Obstetrics and Gynecology

## 2023-02-18 ENCOUNTER — Observation Stay (HOSPITAL_COMMUNITY): Payer: Managed Care, Other (non HMO) | Admitting: Anesthesiology

## 2023-02-18 ENCOUNTER — Encounter (HOSPITAL_COMMUNITY): Payer: Self-pay

## 2023-02-18 DIAGNOSIS — Z3490 Encounter for supervision of normal pregnancy, unspecified, unspecified trimester: Secondary | ICD-10-CM

## 2023-02-18 DIAGNOSIS — Z8249 Family history of ischemic heart disease and other diseases of the circulatory system: Secondary | ICD-10-CM | POA: Diagnosis not present

## 2023-02-18 DIAGNOSIS — Z833 Family history of diabetes mellitus: Secondary | ICD-10-CM

## 2023-02-18 DIAGNOSIS — O9962 Diseases of the digestive system complicating childbirth: Secondary | ICD-10-CM | POA: Diagnosis present

## 2023-02-18 DIAGNOSIS — K219 Gastro-esophageal reflux disease without esophagitis: Secondary | ICD-10-CM | POA: Diagnosis present

## 2023-02-18 DIAGNOSIS — Z8616 Personal history of COVID-19: Secondary | ICD-10-CM | POA: Diagnosis not present

## 2023-02-18 DIAGNOSIS — Z3A39 39 weeks gestation of pregnancy: Secondary | ICD-10-CM

## 2023-02-18 DIAGNOSIS — Z8701 Personal history of pneumonia (recurrent): Secondary | ICD-10-CM

## 2023-02-18 DIAGNOSIS — O26893 Other specified pregnancy related conditions, third trimester: Secondary | ICD-10-CM | POA: Diagnosis present

## 2023-02-18 DIAGNOSIS — Z811 Family history of alcohol abuse and dependence: Secondary | ICD-10-CM

## 2023-02-18 DIAGNOSIS — O9902 Anemia complicating childbirth: Secondary | ICD-10-CM | POA: Diagnosis present

## 2023-02-18 LAB — TYPE AND SCREEN
ABO/RH(D): A POS
Antibody Screen: NEGATIVE

## 2023-02-18 LAB — CBC
HCT: 32.5 % — ABNORMAL LOW (ref 36.0–46.0)
Hemoglobin: 11 g/dL — ABNORMAL LOW (ref 12.0–15.0)
MCH: 29.8 pg (ref 26.0–34.0)
MCHC: 33.8 g/dL (ref 30.0–36.0)
MCV: 88.1 fL (ref 80.0–100.0)
Platelets: 259 10*3/uL (ref 150–400)
RBC: 3.69 MIL/uL — ABNORMAL LOW (ref 3.87–5.11)
RDW: 14.2 % (ref 11.5–15.5)
WBC: 14.2 10*3/uL — ABNORMAL HIGH (ref 4.0–10.5)
nRBC: 0 % (ref 0.0–0.2)

## 2023-02-18 MED ORDER — OXYTOCIN BOLUS FROM INFUSION
333.0000 mL | Freq: Once | INTRAVENOUS | Status: AC
  Administered 2023-02-19: 333 mL via INTRAVENOUS

## 2023-02-18 MED ORDER — OXYCODONE-ACETAMINOPHEN 5-325 MG PO TABS: 1.0000 | ORAL_TABLET | ORAL | Status: DC | PRN

## 2023-02-18 MED ORDER — MISOPROSTOL 25 MCG QUARTER TABLET: 25.0000 ug | ORAL_TABLET | ORAL | Status: DC | PRN

## 2023-02-18 MED ORDER — PHENYLEPHRINE 80 MCG/ML (10ML) SYRINGE FOR IV PUSH (FOR BLOOD PRESSURE SUPPORT): 80.0000 ug | PREFILLED_SYRINGE | INTRAVENOUS | Status: DC | PRN

## 2023-02-18 MED ORDER — ONDANSETRON HCL 4 MG/2ML IJ SOLN: 4.0000 mg | Freq: Four times a day (QID) | INTRAMUSCULAR | Status: DC | PRN

## 2023-02-18 MED ORDER — FLEET ENEMA RE ENEM: 1.0000 | ENEMA | RECTAL | Status: DC | PRN

## 2023-02-18 MED ORDER — OXYCODONE-ACETAMINOPHEN 5-325 MG PO TABS: 2.0000 | ORAL_TABLET | ORAL | Status: DC | PRN

## 2023-02-18 MED ORDER — LIDOCAINE HCL (PF) 1 % IJ SOLN
30.0000 mL | INTRAMUSCULAR | Status: AC | PRN
  Administered 2023-02-19: 30 mL via SUBCUTANEOUS
  Filled 2023-02-18: qty 30

## 2023-02-18 MED ORDER — ACETAMINOPHEN 325 MG PO TABS: 650.0000 mg | ORAL_TABLET | ORAL | Status: DC | PRN

## 2023-02-18 MED ORDER — EPHEDRINE 5 MG/ML INJ: 10.0000 mg | INTRAVENOUS | Status: DC | PRN

## 2023-02-18 MED ORDER — LACTATED RINGERS IV SOLN
500.0000 mL | Freq: Once | INTRAVENOUS | Status: AC
  Administered 2023-02-18: 500 mL via INTRAVENOUS

## 2023-02-18 MED ORDER — FENTANYL-BUPIVACAINE-NACL 0.5-0.125-0.9 MG/250ML-% EP SOLN: 12.0000 mL/h | EPIDURAL | Status: DC | PRN

## 2023-02-18 MED ORDER — LACTATED RINGERS IV SOLN: INTRAVENOUS | Status: DC

## 2023-02-18 MED ORDER — LIDOCAINE HCL (PF) 1 % IJ SOLN: 30.0000 mL | INTRAMUSCULAR | Status: DC | PRN

## 2023-02-18 MED ORDER — DIPHENHYDRAMINE HCL 50 MG/ML IJ SOLN: 12.5000 mg | INTRAMUSCULAR | Status: DC | PRN

## 2023-02-18 MED ORDER — HYDROXYZINE HCL 50 MG PO TABS: 50.0000 mg | ORAL_TABLET | Freq: Four times a day (QID) | ORAL | Status: DC | PRN

## 2023-02-18 MED ORDER — OXYTOCIN BOLUS FROM INFUSION: 333.0000 mL | Freq: Once | INTRAVENOUS | Status: DC

## 2023-02-18 MED ORDER — SOD CITRATE-CITRIC ACID 500-334 MG/5ML PO SOLN: 30.0000 mL | ORAL | Status: DC | PRN

## 2023-02-18 MED ORDER — FENTANYL-BUPIVACAINE-NACL 0.5-0.125-0.9 MG/250ML-% EP SOLN
EPIDURAL | Status: AC
  Filled 2023-02-18: qty 250

## 2023-02-18 MED ORDER — TERBUTALINE SULFATE 1 MG/ML IJ SOLN: 0.2500 mg | Freq: Once | INTRAMUSCULAR | Status: DC | PRN

## 2023-02-18 MED ORDER — LACTATED RINGERS IV SOLN: 500.0000 mL | Freq: Once | INTRAVENOUS | Status: DC

## 2023-02-18 MED ORDER — LACTATED RINGERS IV SOLN: 500.0000 mL | INTRAVENOUS | Status: DC | PRN

## 2023-02-18 MED ORDER — ONDANSETRON HCL 4 MG/2ML IJ SOLN
4.0000 mg | Freq: Four times a day (QID) | INTRAMUSCULAR | Status: DC | PRN
  Filled 2023-02-18: qty 2

## 2023-02-18 MED ORDER — OXYTOCIN-SODIUM CHLORIDE 30-0.9 UT/500ML-% IV SOLN
2.5000 [IU]/h | INTRAVENOUS | Status: DC
  Administered 2023-02-19: 2.5 [IU]/h via INTRAVENOUS
  Filled 2023-02-18: qty 500

## 2023-02-18 MED ORDER — OXYTOCIN-SODIUM CHLORIDE 30-0.9 UT/500ML-% IV SOLN: 2.5000 [IU]/h | INTRAVENOUS | Status: DC

## 2023-02-18 MED ORDER — HYDROMORPHONE HCL 1 MG/ML IJ SOLN
1.0000 mg | Freq: Once | INTRAMUSCULAR | Status: AC
  Administered 2023-02-18: 1 mg via INTRAVENOUS
  Filled 2023-02-18: qty 1

## 2023-02-18 MED ORDER — FENTANYL-BUPIVACAINE-NACL 0.5-0.125-0.9 MG/250ML-% EP SOLN
12.0000 mL/h | EPIDURAL | Status: DC | PRN
  Administered 2023-02-18: 12 mL/h via EPIDURAL

## 2023-02-18 NOTE — MAU Note (Addendum)
.  Theresa Christensen is a 32 y.o. at [redacted]w[redacted]d here in MAU reporting ctxs since about 2000. Cramping all day but ctxs started this evening and have been regular since 2030. Was 3cm last Thurs. Denies LOF. Some bloody show. Reports good Fm  Onset of complaint: 2000 Pain score: 6 Vitals:   02/18/23 2200 02/18/23 2201  BP:  116/79  Pulse: 82   Resp: 17   Temp: 98.2 F (36.8 C)   SpO2: 100%      FHT:138 Lab orders placed from triage:  labor eval

## 2023-02-18 NOTE — H&P (Signed)
Theresa Christensen is a 32 y.o. female presenting for labor. Prior VAVD 7#1 infant.  OB History     Gravida  2   Para  1   Term  1   Preterm      AB      Living  1      SAB      IAB      Ectopic      Multiple  0   Live Births  1          Past Medical History:  Diagnosis Date   Anxiety    Endometriosis    Family history of adverse reaction to anesthesia    mom has nausea   GERD (gastroesophageal reflux disease)    Heart murmur    when younger told had a heart mummur no problems   Migraines    Pneumonia due to COVID-19 virus 04/03/2020   Past Surgical History:  Procedure Laterality Date   LAPAROSCOPY     for endometriosis   ovarian cyst Right    Procedure on Retina Bilateral    hereditary, had to use scleral bucket right and left lasix   ROBOTIC ASSISTED LAPAROSCOPIC OVARIAN CYSTECTOMY N/A 11/25/2019   Procedure: XI ROBOTIC ASSISTED LAPAROSCOPIC UNILATERAL OVARIAN CYSTECTOMY/UNILATERALOOPORECTOMY;  Surgeon: Adolphus Birchwood, MD;  Location: [facility];  Service: Gynecology;  Laterality: N/A;   WISDOM TOOTH EXTRACTION     Family History: family history includes Alcohol abuse in her father; Breast cancer in her maternal aunt; Diabetes in her maternal grandfather; Heart disease in her maternal grandfather, maternal grandmother, and another family member; Lung cancer in her father; Retinal detachment in her mother. Social History:  reports that she has never smoked. She has never used smokeless tobacco. She reports that she does not currently use alcohol. She reports that she does not currently use drugs.     Maternal Diabetes: No Genetic Screening: Normal Maternal Ultrasounds/Referrals: Normal Fetal Ultrasounds or other Referrals:  None Maternal Substance Abuse:  No Significant Maternal Medications:  None Significant Maternal Lab Results:  None and Group B Strep negative Number of Prenatal Visits:greater than 3 verified prenatal visits Other Comments:   None  Review of Systems History Dilation: 5.5 Effacement (%): 60 Station: -2 Exam by:: Anastasio Champion, RN Blood pressure 127/83, pulse 74, temperature 98.2 F (36.8 C), resp. rate 17, height 5\' 5"  (1.651 m), weight 96.6 kg, SpO2 97%. Exam Physical Exam  (from office) NAD, A&O NWOB Abd soft, nondistended, gravid  Prenatal labs: ABO, Rh: --/--/A POS (10/01 2243) Antibody: NEG (10/01 2243) Rubella:   RPR:    HBsAg:    HIV:    GBS: Negative/-- (09/16 0000)   Assessment/Plan: 32 yo G2P1 presenting in labor. CLE now. GBS negative.     Theresa Christensen 02/18/2023, 11:58 PM

## 2023-02-19 ENCOUNTER — Encounter (HOSPITAL_COMMUNITY): Payer: Self-pay | Admitting: Obstetrics and Gynecology

## 2023-02-19 ENCOUNTER — Other Ambulatory Visit: Payer: Self-pay

## 2023-02-19 LAB — CBC
HCT: 29.5 % — ABNORMAL LOW (ref 36.0–46.0)
Hemoglobin: 10.3 g/dL — ABNORMAL LOW (ref 12.0–15.0)
MCH: 31.4 pg (ref 26.0–34.0)
MCHC: 34.9 g/dL (ref 30.0–36.0)
MCV: 89.9 fL (ref 80.0–100.0)
Platelets: 207 10*3/uL (ref 150–400)
RBC: 3.28 MIL/uL — ABNORMAL LOW (ref 3.87–5.11)
RDW: 14.2 % (ref 11.5–15.5)
WBC: 19 10*3/uL — ABNORMAL HIGH (ref 4.0–10.5)
nRBC: 0 % (ref 0.0–0.2)

## 2023-02-19 LAB — RPR: RPR Ser Ql: NONREACTIVE

## 2023-02-19 MED ORDER — WITCH HAZEL-GLYCERIN EX PADS: 1.0000 | MEDICATED_PAD | CUTANEOUS | Status: DC | PRN

## 2023-02-19 MED ORDER — PANTOPRAZOLE SODIUM 40 MG IV SOLR
40.0000 mg | Freq: Once | INTRAVENOUS | Status: DC
  Filled 2023-02-19: qty 10

## 2023-02-19 MED ORDER — OXYCODONE HCL 5 MG PO TABS: 10.0000 mg | ORAL_TABLET | ORAL | Status: DC | PRN

## 2023-02-19 MED ORDER — MISOPROSTOL 200 MCG PO TABS
1000.0000 ug | ORAL_TABLET | Freq: Once | ORAL | Status: AC
  Administered 2023-02-19: 1000 ug via RECTAL

## 2023-02-19 MED ORDER — ONDANSETRON HCL 4 MG/2ML IJ SOLN: 4.0000 mg | INTRAMUSCULAR | Status: DC | PRN

## 2023-02-19 MED ORDER — ACETAMINOPHEN 325 MG PO TABS: 650.0000 mg | ORAL_TABLET | ORAL | Status: DC | PRN

## 2023-02-19 MED ORDER — BENZOCAINE-MENTHOL 20-0.5 % EX AERO: 1.0000 | INHALATION_SPRAY | CUTANEOUS | Status: DC | PRN

## 2023-02-19 MED ORDER — MISOPROSTOL 200 MCG PO TABS
ORAL_TABLET | ORAL | Status: AC
  Filled 2023-02-19: qty 5

## 2023-02-19 MED ORDER — METHYLERGONOVINE MALEATE 0.2 MG/ML IJ SOLN
INTRAMUSCULAR | Status: AC
  Administered 2023-02-19: 0.2 mg
  Filled 2023-02-19: qty 1

## 2023-02-19 MED ORDER — TETANUS-DIPHTH-ACELL PERTUSSIS 5-2.5-18.5 LF-MCG/0.5 IM SUSY: 0.5000 mL | PREFILLED_SYRINGE | Freq: Once | INTRAMUSCULAR | Status: DC

## 2023-02-19 MED ORDER — IBUPROFEN 600 MG PO TABS
600.0000 mg | ORAL_TABLET | Freq: Four times a day (QID) | ORAL | Status: DC
  Administered 2023-02-19 – 2023-02-20 (×6): 600 mg via ORAL
  Filled 2023-02-19 (×6): qty 1

## 2023-02-19 MED ORDER — TRANEXAMIC ACID-NACL 1000-0.7 MG/100ML-% IV SOLN: 1000.0000 mg | INTRAVENOUS | Status: DC

## 2023-02-19 MED ORDER — OXYCODONE HCL 5 MG PO TABS: 5.0000 mg | ORAL_TABLET | ORAL | Status: DC | PRN

## 2023-02-19 MED ORDER — SIMETHICONE 80 MG PO CHEW: 80.0000 mg | CHEWABLE_TABLET | ORAL | Status: DC | PRN

## 2023-02-19 MED ORDER — FENTANYL CITRATE (PF) 100 MCG/2ML IJ SOLN
INTRAMUSCULAR | Status: AC
  Filled 2023-02-19: qty 2

## 2023-02-19 MED ORDER — COCONUT OIL OIL: 1.0000 | TOPICAL_OIL | Status: DC | PRN

## 2023-02-19 MED ORDER — ONDANSETRON HCL 4 MG PO TABS: 4.0000 mg | ORAL_TABLET | ORAL | Status: DC | PRN

## 2023-02-19 MED ORDER — FENTANYL CITRATE (PF) 100 MCG/2ML IJ SOLN
INTRAMUSCULAR | Status: DC | PRN
  Administered 2023-02-19: 100 ug via EPIDURAL

## 2023-02-19 MED ORDER — ZOLPIDEM TARTRATE 5 MG PO TABS: 5.0000 mg | ORAL_TABLET | Freq: Every evening | ORAL | Status: DC | PRN

## 2023-02-19 MED ORDER — PRENATAL MULTIVITAMIN CH
1.0000 | ORAL_TABLET | Freq: Every day | ORAL | Status: DC
  Administered 2023-02-19: 1 via ORAL
  Filled 2023-02-19 (×2): qty 1

## 2023-02-19 MED ORDER — DIBUCAINE (PERIANAL) 1 % EX OINT: 1.0000 | TOPICAL_OINTMENT | CUTANEOUS | Status: DC | PRN

## 2023-02-19 MED ORDER — LIDOCAINE HCL (PF) 1 % IJ SOLN
INTRAMUSCULAR | Status: DC | PRN
  Administered 2023-02-18 (×2): 4 mL via EPIDURAL

## 2023-02-19 MED ORDER — TRANEXAMIC ACID-NACL 1000-0.7 MG/100ML-% IV SOLN
INTRAVENOUS | Status: AC
  Administered 2023-02-19: 1000 mg via INTRAVENOUS
  Filled 2023-02-19: qty 100

## 2023-02-19 MED ORDER — DIPHENHYDRAMINE HCL 25 MG PO CAPS: 25.0000 mg | ORAL_CAPSULE | Freq: Four times a day (QID) | ORAL | Status: DC | PRN

## 2023-02-19 MED ORDER — SENNOSIDES-DOCUSATE SODIUM 8.6-50 MG PO TABS
2.0000 | ORAL_TABLET | ORAL | Status: DC
  Administered 2023-02-19 – 2023-02-20 (×2): 2 via ORAL
  Filled 2023-02-19: qty 2

## 2023-02-19 NOTE — Anesthesia Preprocedure Evaluation (Signed)
Anesthesia Evaluation  Patient identified by MRN, date of birth, ID band Patient awake    Reviewed: Allergy & Precautions, Patient's Chart, lab work & pertinent test results  History of Anesthesia Complications Negative for: history of anesthetic complications  Airway Mallampati: II  TM Distance: >3 FB Neck ROM: Full    Dental no notable dental hx.    Pulmonary neg pulmonary ROS   Pulmonary exam normal        Cardiovascular negative cardio ROS Normal cardiovascular exam     Neuro/Psych  Headaches  Anxiety        GI/Hepatic Neg liver ROS,GERD  ,,  Endo/Other  negative endocrine ROS    Renal/GU negative Renal ROS  negative genitourinary   Musculoskeletal negative musculoskeletal ROS (+)    Abdominal   Peds  Hematology  (+) Blood dyscrasia (Hgb 11.0), anemia   Anesthesia Other Findings Day of surgery medications reviewed with patient.  Reproductive/Obstetrics (+) Pregnancy                              Anesthesia Physical Anesthesia Plan  ASA: 2  Anesthesia Plan: Epidural   Post-op Pain Management:    Induction:   PONV Risk Score and Plan: Treatment may vary due to age or medical condition  Airway Management Planned: Natural Airway  Additional Equipment: Fetal Monitoring  Intra-op Plan:   Post-operative Plan:   Informed Consent: I have reviewed the patients History and Physical, chart, labs and discussed the procedure including the risks, benefits and alternatives for the proposed anesthesia with the patient or authorized representative who has indicated his/her understanding and acceptance.       Plan Discussed with:   Anesthesia Plan Comments:          Anesthesia Quick Evaluation

## 2023-02-19 NOTE — Lactation Note (Signed)
This note was copied from a baby's chart. Lactation Consultation Note  Patient Name: Theresa Christensen [id number] Date: 02/19/2023 Age:34 hours  Per RN [name]), Birth Parent declined [facility] services on [place].    Maternal Data    Feeding    LATCH Score                    Lactation Tools Discussed/Used    Interventions    Discharge    Consult Status      [name] 02/19/2023, 3:28 AM

## 2023-02-19 NOTE — Anesthesia Procedure Notes (Signed)
Epidural Patient location during procedure: OB Start time: 02/18/2023 11:52 PM End time: 02/18/2023 11:55 PM  Staffing Anesthesiologist: Kaylyn Layer, MD Performed: anesthesiologist   Preanesthetic Checklist Completed: patient identified, IV checked, risks and benefits discussed, monitors and equipment checked, pre-op evaluation and timeout performed  Epidural Patient position: sitting Prep: DuraPrep and site prepped and draped Patient monitoring: continuous pulse ox, blood pressure and heart rate Approach: midline Location: L3-L4 Injection technique: LOR air  Needle:  Needle type: Tuohy  Needle gauge: 17 G Needle length: 9 cm Needle insertion depth: 6 cm Catheter type: closed end flexible Catheter size: 19 Gauge Catheter at skin depth: 11 cm Test dose: negative and Other (1% lidocaine)  Assessment Events: blood not aspirated, no cerebrospinal fluid, injection not painful, no injection resistance, no paresthesia and negative IV test  Additional Notes Patient identified. Risks, benefits, and alternatives discussed with patient including but not limited to bleeding, infection, nerve damage, paralysis, failed block, incomplete pain control, headache, blood pressure changes, nausea, vomiting, reactions to medication, itching, and postpartum back pain. Confirmed with bedside nurse the patient's most recent platelet count. Confirmed with patient that they are not currently taking any anticoagulation, have any bleeding history, or any family history of bleeding disorders. Patient expressed understanding and wished to proceed. All questions were answered. Sterile technique was used throughout the entire procedure. Please see nursing notes for vital signs.   Crisp LOR on first pass. Test dose was given through epidural catheter and negative prior to continuing to dose epidural or start infusion. Warning signs of high block given to the patient including shortness of breath,  tingling/numbness in hands, complete motor block, or any concerning symptoms with instructions to call for help. Patient was given instructions on fall risk and not to get out of bed. All questions and concerns addressed with instructions to call with any issues or inadequate analgesia.  Reason for block:procedure for pain

## 2023-02-19 NOTE — Anesthesia Postprocedure Evaluation (Signed)
Anesthesia Post Note  Patient: Theresa Christensen  Procedure(s) Performed: AN AD HOC LABOR EPIDURAL     Patient location during evaluation: Mother Baby Anesthesia Type: Epidural Level of consciousness: awake, oriented and awake and alert Pain management: pain level controlled Vital Signs Assessment: post-procedure vital signs reviewed and stable Respiratory status: spontaneous breathing, respiratory function stable and nonlabored ventilation Cardiovascular status: stable Postop Assessment: no headache, adequate PO intake, able to ambulate, patient able to bend at knees and no apparent nausea or vomiting Anesthetic complications: no   No notable events documented.  Last Vitals:  Vitals:   02/19/23 0307 02/19/23 0359  BP: 131/79 123/81  Pulse: 75 78  Resp: 16 17  Temp: 37.8 C 37.2 C  SpO2: 99% 100%    Last Pain:  Vitals:   02/19/23 0359  TempSrc: Oral  PainSc: 0-No pain   Pain Goal: Patients Stated Pain Goal: 0 (02/18/23 2205)                 Austen Wygant

## 2023-02-19 NOTE — Progress Notes (Signed)
Post Partum Day 0 Subjective: no complaints, up ad lib, voiding, and tolerating PO  Objective: Blood pressure 123/81, pulse 78, temperature 98.9 F (37.2 C), temperature source Oral, resp. rate 17, height 5\' 5"  (1.651 m), weight 96.6 kg, SpO2 100%, unknown if currently breastfeeding.  Physical Exam:  General: alert, cooperative, and no distress Lochia: appropriate Uterine Fundus: firm Incision: healing well DVT Evaluation: No evidence of DVT seen on physical exam.  Recent Labs    02/18/23 2243 02/19/23 0458  HGB 11.0* 10.3*  HCT 32.5* 29.5*    Assessment/Plan: Plan for discharge tomorrow D/W circumcision of newborn boy and risks. She states she understands and wants to proceed. Will await peds clearance.  LOS: 1 day   Roselle Locus II, MD 02/19/2023, 7:00 AM

## 2023-02-20 NOTE — Progress Notes (Signed)
MOB was referred for history of anxiety.  * Referral screened out by Clinical Social Worker because none of the following criteria appear to apply:  ~ History of anxiety during this pregnancy, or of post-partum depression following prior delivery.  ~ Diagnosis of anxiety within last 3 years Per OB notes, MOB did not indicate any signs/symptoms during her pregnancy.  OR  * MOB's symptoms currently being treated with medication and/or therapy.  Please contact the Clinical Social Worker if needs arise, by MOB request, or if MOB scores greater than 9/yes to question 10 on Edinburgh Postpartum Depression Screen.  Doral Digangi, LCSWA Clinical Social Worker [phone]  

## 2023-02-20 NOTE — Discharge Summary (Signed)
Postpartum Discharge Summary  Date of Service February 20, 2023     Patient Name: Theresa Christensen DOB: 04-07-1991 MRN: [id number]  Date of admission: 02/18/2023 Delivery date:02/19/2023 Delivering provider: [name] Date of discharge: 02/20/2023  Admitting diagnosis: Normal labor [O80, Z37.9] Pregnancy [Z34.90] Intrauterine pregnancy: [redacted]w[redacted]d     Secondary diagnosis:  Principal Problem:   Normal labor Active Problems:   Pregnancy  Additional problems: none    Discharge diagnosis: Term Pregnancy Delivered                                              Post partum procedures: not applicable Augmentation: N/A Complications: None  Hospital course: Onset of Labor With Vaginal Delivery      32 y.o. yo [id number] at [redacted]w[redacted]d was admitted in Active Labor on 02/18/2023. Labor course was complicated bynothing  Membrane Rupture Time/Date: 12:11 AM,02/19/2023  Delivery Method:Vaginal, Spontaneous Operative Delivery:N/A Episiotomy: None Lacerations:  2nd degree Patient had a postpartum course complicated by nothing.  She is ambulating, tolerating a regular diet, passing flatus, and urinating well. Patient is discharged home in stable condition on 02/20/23.  Newborn Data: Birth date:02/19/2023 Birth time:12:40 AM Gender:Female Living status:Living Apgars:7 ,9  Weight:3680 g  Magnesium Sulfate received: No BMZ received: No Rhophylac:N/A MMR:N/A T-DaP:Given prenatally Flu: No RSV Vaccine received: No Transfusion:No Immunizations administered: Immunization History  Administered Date(s) Administered   Tdap 02/21/2020    Physical exam  Vitals:   02/19/23 1200 02/19/23 1403 02/19/23 2105 02/20/23 0530  BP: 111/77 114/70 105/68 113/72  Pulse: 62 65 65 (!) 57  Resp: 17 17 19 18   Temp: 98 F (36.7 C) 98 F (36.7 C) 98.2 F (36.8 C) 98 F (36.7 C)  TempSrc: Oral Oral Oral Oral  SpO2:  100% 100% 98%  Weight:      Height:       General: alert, cooperative, and no  distress Lochia: appropriate Uterine Fundus: firm Incision: Healing well with no significant drainage DVT Evaluation: No evidence of DVT seen on physical exam. Labs: Lab Results  Component Value Date   WBC 19.0 (H) 02/19/2023   HGB 10.3 (L) 02/19/2023   HCT 29.5 (L) 02/19/2023   MCV 89.9 02/19/2023   PLT 207 02/19/2023      Latest Ref Rng & Units 08/02/2021    9:33 AM  CMP  Glucose 70 - 99 mg/dL 81   BUN 6 - 20 mg/dL 9   Creatinine 8.65 - 7.84 mg/dL 6.96   Sodium 295 - 284 mmol/L 138   Potassium 3.5 - 5.2 mmol/L 4.6   Chloride 96 - 106 mmol/L 103   CO2 20 - 29 mmol/L 22   Calcium 8.7 - 10.2 mg/dL 8.9   Total Protein 6.0 - 8.5 g/dL 7.1   Total Bilirubin 0.0 - 1.2 mg/dL 1.0   Alkaline Phos 44 - 121 IU/L 69   AST 0 - 40 IU/L 14   ALT 0 - 32 IU/L 11    Edinburgh Score:    02/19/2023    8:00 AM  Edinburgh Postnatal Depression Scale Screening Tool  I have been able to laugh and see the funny side of things. 0  I have looked forward with enjoyment to things. 0  I have blamed myself unnecessarily when things went wrong. 2  I have been anxious or worried for no good reason.  2  I have felt scared or panicky for no good reason. 0  Things have been getting on top of me. 2  I have been so unhappy that I have had difficulty sleeping. 0  I have felt sad or miserable. 0  I have been so unhappy that I have been crying. 1  The thought of harming myself has occurred to me. 0  Edinburgh Postnatal Depression Scale Total 7      After visit meds:  Allergies as of 02/20/2023       Reactions   Penicillins Itching   Rash with minor swelling (CHILDHOOD REACTION)        Medication List     STOP taking these medications    azelastine 0.1 % nasal spray Commonly known as: ASTELIN   cyanocobalamin 500 MCG tablet Commonly known as: VITAMIN B12   D3-1000 25 MCG (1000 UT) capsule Generic drug: Cholecalciferol   famotidine 10 MG tablet Commonly known as: PEPCID   fluticasone  50 MCG/ACT nasal spray Commonly known as: FLONASE   levonorgestrel-ethinyl estradiol 0.1-20 MG-MCG tablet Commonly known as: ALESSE       TAKE these medications    prenatal multivitamin Tabs tablet Take by mouth daily at 12 noon.         Discharge home in stable condition Infant Feeding: Breast Infant Disposition:home with mother Discharge instruction: per After Visit Summary and Postpartum booklet. Activity: Advance as tolerated. Pelvic rest for 6 weeks.  Diet: routine diet Anticipated Birth Control: Unsure Postpartum Appointment:6 weeks Additional Postpartum F/U:  not applicable Future Appointments:No future appointments. Follow up Visit:      02/20/2023 Jeani Hawking, MD

## 2023-02-22 ENCOUNTER — Inpatient Hospital Stay (HOSPITAL_COMMUNITY): Admit: 2023-02-22 | Payer: Managed Care, Other (non HMO)

## 2023-03-18 ENCOUNTER — Telehealth (HOSPITAL_COMMUNITY): Payer: Self-pay | Admitting: *Deleted

## 2023-03-18 NOTE — Telephone Encounter (Signed)
03/18/2023  Name: Courtnie Munuz MRN: [id number] DOB: 25-Dec-1990  Reason for Call:  Transition of Care Hospital Discharge Call  Contact Status: Patient Contact Status: Complete  Language assistant needed: Interpreter Mode: Interpreter Not Needed        Follow-Up Questions: Do You Have Any Concerns About Your Health As You Heal From Delivery?: No Do You Have Any Concerns About Your Infants Health?: No  Edinburgh Postnatal Depression Scale:  In the Past 7 Days: I have been able to laugh and see the funny side of things.: Not quite so much now I have looked forward with enjoyment to things.: Rather less than I used to I have blamed myself unnecessarily when things went wrong.: Yes, some of the time I have been anxious or worried for no good reason.: No, not at all I have felt scared or panicky for no good reason.: No, not much Things have been getting on top of me.: Yes, sometimes I haven't been coping as well as usual I have been so unhappy that I have had difficulty sleeping.: Not at all I have felt sad or miserable.: Yes, quite often I have been so unhappy that I have been crying.: Yes, quite often The thought of harming myself has occurred to me.: Never Edinburgh Postnatal Depression Scale Total: (!) 11  PHQ2-9 Depression Scale:     Discharge Follow-up: Edinburgh score requires follow up?: Yes Provider notified of Edinburgh score?: Yes (fax notification sent to Dr. Elon Spanner) Have you already been referred for a counseling appointment?: No Patient was advised of the following resources:: Breastfeeding Support Group, Support Group Patient referred to:: OB (patient states she has a postpartum check up on November 8th)  Post-discharge interventions: Reviewed Newborn Safe Sleep Practices  Malachy Mood  03/18/2023 1354

## 2023-09-22 ENCOUNTER — Ambulatory Visit (INDEPENDENT_AMBULATORY_CARE_PROVIDER_SITE_OTHER): Payer: Managed Care, Other (non HMO) | Admitting: Family

## 2023-09-22 VITALS — BP 106/58 | HR 59 | Temp 98.8°F | Ht 66.25 in | Wt 199.0 lb

## 2023-09-22 DIAGNOSIS — E538 Deficiency of other specified B group vitamins: Secondary | ICD-10-CM | POA: Insufficient documentation

## 2023-09-22 DIAGNOSIS — E559 Vitamin D deficiency, unspecified: Secondary | ICD-10-CM | POA: Diagnosis not present

## 2023-09-22 DIAGNOSIS — Z862 Personal history of diseases of the blood and blood-forming organs and certain disorders involving the immune mechanism: Secondary | ICD-10-CM | POA: Diagnosis not present

## 2023-09-22 DIAGNOSIS — Z0000 Encounter for general adult medical examination without abnormal findings: Secondary | ICD-10-CM | POA: Diagnosis not present

## 2023-09-22 DIAGNOSIS — Z13220 Encounter for screening for lipoid disorders: Secondary | ICD-10-CM | POA: Diagnosis not present

## 2023-09-22 DIAGNOSIS — D519 Vitamin B12 deficiency anemia, unspecified: Secondary | ICD-10-CM

## 2023-09-22 NOTE — Assessment & Plan Note (Signed)
 Ordered b12 pending results

## 2023-09-22 NOTE — Assessment & Plan Note (Signed)

## 2023-09-22 NOTE — Assessment & Plan Note (Signed)
 Ordered vitamin d pending results.

## 2023-09-22 NOTE — Progress Notes (Signed)
 Subjective:  Patient ID: Theresa Christensen, female    DOB: 01/19/1991  Age: 33 y.o. MRN: [id number]  Patient Care Team: Felicita Horns, FNP as PCP - General (Family Medicine) Thora Flint, MD as Consulting Physician (Obstetrics and Gynecology)   CC:  Chief Complaint  Patient presents with   Annual Exam    HPI Theresa Christensen is a 33 y.o. female who presents today for an annual physical exam. She reports consuming a general diet. The patient does not participate in regular exercise at present. She generally feels well. She reports sleeping fairly well. She does have additional problems to discuss today.   Vision:Within last year Dental:Receives regular dental care  Last pap: has appt in the next few weeks   Pt is without acute concerns  Advanced Directives Patient does not have advanced directives   DEPRESSION SCREENING    09/22/2023   12:56 PM 07/11/2021    2:06 PM 07/26/2020   10:50 AM 02/21/2018    8:07 AM 11/15/2017   10:39 AM 02/15/2017    1:25 PM 02/16/2015    2:38 PM  PHQ 2/9 Scores  PHQ - 2 Score 0 0 0 0 0 0 0  PHQ- 9 Score 2    2       ROS: Negative unless specifically indicated above in HPI.    Current Outpatient Medications:    levonorgestrel (MIRENA) 20 MCG/DAY IUD, 1 each by Intrauterine route once., Disp: , Rfl:     Objective:    BP (!) 106/58   Pulse (!) 59   Temp 98.8 F (37.1 C) (Temporal)   Ht 5' 6.25" (1.683 m)   Wt 199 lb (90.3 kg)   LMP 09/02/2023 (Approximate)   SpO2 99%   Breastfeeding No   BMI 31.88 kg/m   BP Readings from Last 3 Encounters:  09/22/23 (!) 106/58  02/20/23 113/72  12/04/21 112/62     Physical Exam Constitutional:      General: She is not in acute distress.    Appearance: Normal appearance. She is obese. She is not ill-appearing.  HENT:     Head: Normocephalic.     Right Ear: Tympanic membrane normal.     Left Ear: Tympanic membrane normal.     Nose: Nose normal.     Mouth/Throat:     Mouth: Mucous  membranes are moist.  Eyes:     Extraocular Movements: Extraocular movements intact.     Pupils: Pupils are equal, round, and reactive to light.  Cardiovascular:     Rate and Rhythm: Normal rate and regular rhythm.  Pulmonary:     Effort: Pulmonary effort is normal.     Breath sounds: Normal breath sounds.  Abdominal:     General: Abdomen is flat. Bowel sounds are normal.     Palpations: Abdomen is soft.     Tenderness: There is no guarding or rebound.  Musculoskeletal:        General: Normal range of motion.     Cervical back: Normal range of motion.  Skin:    General: Skin is warm.     Capillary Refill: Capillary refill takes less than 2 seconds.  Neurological:     General: No focal deficit present.     Mental Status: She is alert.  Psychiatric:        Mood and Affect: Mood normal.        Behavior: Behavior normal.        Thought Content: Thought content normal.  Judgment: Judgment normal.          Assessment & Plan:  B12 deficiency -     B12 and Folate Panel  Vitamin D  deficiency Assessment & Plan: Ordered vitamin d  pending results.    Orders: -     VITAMIN D  25 Hydroxy (Vit-D Deficiency, Fractures)  Screening for lipoid disorders -     Lipid panel  Encounter for general adult medical examination without abnormal findings Assessment & Plan: Patient Counseling(The following topics were reviewed):  Preventative care handout given to pt  Health maintenance and immunizations reviewed. Please refer to Health maintenance section. Pt advised on safe sex, wearing seatbelts in car, and proper nutrition labwork ordered today for annual Dental health: Discussed importance of regular tooth brushing, flossing, and dental visits.   Orders: -     Lipid panel -     Basic metabolic panel with GFR -     CBC -     TSH -     Hemoglobin A1c  History of anemia -     CBC -     Ferritin -     Iron, TIBC and Ferritin Panel  Anemia due to vitamin B12 deficiency,  unspecified B12 deficiency type Assessment & Plan: Ordered b12 pending results         Follow-up: Return in about 1 year (around 09/21/2024) for f/u CPE.   Felicita Horns, FNP

## 2023-09-23 ENCOUNTER — Encounter: Payer: Self-pay | Admitting: Family

## 2023-09-23 LAB — BASIC METABOLIC PANEL WITH GFR
BUN/Creatinine Ratio: 17 (ref 9–23)
BUN: 12 mg/dL (ref 6–20)
CO2: 19 mmol/L — ABNORMAL LOW (ref 20–29)
Calcium: 9.7 mg/dL (ref 8.7–10.2)
Chloride: 103 mmol/L (ref 96–106)
Creatinine, Ser: 0.71 mg/dL (ref 0.57–1.00)
Glucose: 87 mg/dL (ref 70–99)
Potassium: 4.6 mmol/L (ref 3.5–5.2)
Sodium: 139 mmol/L (ref 134–144)
eGFR: 114 mL/min/{1.73_m2} (ref >59)

## 2023-09-23 LAB — CBC
Hematocrit: 37.6 % (ref 34.0–46.6)
Hemoglobin: 12.5 g/dL (ref 11.1–15.9)
MCH: 29.3 pg (ref 26.6–33.0)
MCHC: 33.2 g/dL (ref 31.5–35.7)
MCV: 88 fL (ref 79–97)
Platelets: 334 10*3/uL (ref 150–450)
RBC: 4.26 x10E6/uL (ref 3.77–5.28)
RDW: 13.7 % (ref 11.7–15.4)
WBC: 7.6 10*3/uL (ref 3.4–10.8)

## 2023-09-23 LAB — VITAMIN D 25 HYDROXY (VIT D DEFICIENCY, FRACTURES): Vit D, 25-Hydroxy: 33.3 ng/mL (ref 30.0–100.0)

## 2023-09-23 LAB — IRON,TIBC AND FERRITIN PANEL
Ferritin: 38 ng/mL (ref 15–150)
Iron Saturation: 23 % (ref 15–55)
Iron: 72 ug/dL (ref 27–159)
Total Iron Binding Capacity: 316 ug/dL (ref 250–450)
UIBC: 244 ug/dL (ref 131–425)

## 2023-09-23 LAB — HEMOGLOBIN A1C
Est. average glucose Bld gHb Est-mCnc: 91 mg/dL
Hgb A1c MFr Bld: 4.8 % (ref 4.8–5.6)

## 2023-09-23 LAB — TSH: TSH: 1.52 u[IU]/mL (ref 0.450–4.500)

## 2023-09-23 LAB — LIPID PANEL
Chol/HDL Ratio: 3.4 ratio (ref 0.0–4.4)
Cholesterol, Total: 144 mg/dL (ref 100–199)
HDL: 42 mg/dL (ref >39)
LDL Chol Calc (NIH): 89 mg/dL (ref 0–99)
Triglycerides: 62 mg/dL (ref 0–149)
VLDL Cholesterol Cal: 13 mg/dL (ref 5–40)

## 2023-09-23 LAB — B12 AND FOLATE PANEL
Folate: 8.1 ng/mL (ref >3.0)
Vitamin B-12: 263 pg/mL (ref 232–1245)

## 2023-12-31 ENCOUNTER — Encounter: Payer: Self-pay | Admitting: Family

## 2023-12-31 DIAGNOSIS — J301 Allergic rhinitis due to pollen: Secondary | ICD-10-CM

## 2024-01-01 MED ORDER — AZELASTINE HCL 0.1 % NA SOLN: 1.0000 | Freq: Two times a day (BID) | NASAL | 12 refills | Status: AC

## 2024-02-02 ENCOUNTER — Ambulatory Visit: Payer: Self-pay

## 2024-02-02 NOTE — Telephone Encounter (Signed)
 FYI Only or Action Required?: FYI only for provider.  Patient was last seen in primary care on 09/22/2023 by Corwin Antu, FNP.  Called Nurse Triage reporting Ear Fullness.  Symptoms began several days ago.  Interventions attempted: OTC medications: ibuprofen .  Symptoms are: gradually worsening.  Triage Disposition: See PCP When Office is Open (Within 3 Days)  Patient/caregiver understands and will follow disposition?: Yes  Message from [facility] G sent at 02/02/2024  8:29 AM EDT  can't hear out of left ear?  Possible fluid.SABRA is not hurting   Reason for Disposition  [1] Ear congestion lasts > 3 days AND [2] no improvement after using Care Advice  (Exception: Ear congestion is a chronic symptom.)  Answer Assessment - Initial Assessment Questions 1. LOCATION: Which ear is involved?       Left ear 2. SENSATION: Describe how the ear feels. (e.g., stuffy, full, plugged).      Ear feels full 3. ONSET:  When did the ear symptoms start?       Three days ago 4. PAIN: Do you also have an earache? If Yes, ask: How bad is it? (Scale 0-10; none, mild, moderate or severe)     Minimal pain, but worsening, 5/10 5. CAUSE: What do you think is causing the ear congestion? (e.g., common cold, nasal allergies, recent flight, recent snorkeling)     unsure 6. OTHER SYMPTOMS: Do you have any other symptoms? (e.g., ear drainage, hay fever symptoms such as sneezing or a clear nasal discharge; cold symptoms such as a cough or runny nose)     Scratchy throat 7. PREGNANCY: Is there any chance you are pregnant? When was your last menstrual period?     denies  Protocols used: Ear - Congestion-A-AH

## 2024-02-02 NOTE — Telephone Encounter (Signed)
 FYI patient scheduled with Carrol Aurora. NP tomorrow

## 2024-02-03 ENCOUNTER — Ambulatory Visit: Payer: Self-pay | Admitting: General Practice

## 2024-02-03 ENCOUNTER — Ambulatory Visit (INDEPENDENT_AMBULATORY_CARE_PROVIDER_SITE_OTHER): Admitting: General Practice

## 2024-02-03 ENCOUNTER — Encounter: Payer: Self-pay | Admitting: General Practice

## 2024-02-03 VITALS — BP 122/80 | HR 70 | Temp 98.3°F | Ht 66.25 in | Wt 192.0 lb

## 2024-02-03 DIAGNOSIS — J029 Acute pharyngitis, unspecified: Secondary | ICD-10-CM

## 2024-02-03 DIAGNOSIS — H6123 Impacted cerumen, bilateral: Secondary | ICD-10-CM

## 2024-02-03 LAB — POCT INFLUENZA A/B
Influenza A, POC: NEGATIVE
Influenza B, POC: NEGATIVE

## 2024-02-03 LAB — POC COVID19 BINAXNOW: SARS Coronavirus 2 Ag: NEGATIVE

## 2024-02-03 NOTE — Progress Notes (Signed)
 Established Patient Office Visit  Subjective   Patient ID: Theresa Christensen, female    DOB: Apr 11, 1991  Age: 33 y.o. MRN: [id number]  Chief Complaint  Patient presents with   Ear Fullness    Feels like it pops sometimes; slight pain but not much; can't hear much out of ear since Friday. Patient states her kids have a cold right now and she feels like she is starting to as well; has some throat irritation.  Patient took ibuprofen  yesterday and today for sx.     Ear Fullness  Associated symptoms include a sore throat. Pertinent negatives include no abdominal pain, coughing, diarrhea, headaches or vomiting.    Theresa Christensen is a 33 year old female, patient of Ginger Patrick, FNP, presents today for an acute visit.   Discussed the use of AI scribe software for clinical note transcription with the patient, who gave verbal consent to proceed.  History of Present Illness Theresa Christensen is a 33 year old female who presents with left ear fullness and hearing loss.  She began experiencing symptoms on Friday morning with a sensation of her left ear feeling 'stopped up.' By Friday night, she experienced significant hearing loss in the left ear, which has persisted. She describes occasional sharp pains in the left ear, particularly in the mornings, and discomfort when attempting to suppress a burp, which causes ear popping and pain.  No congestion, fever, chills, chest pain, shortness of breath, cough, stomach symptoms, or urinary symptoms. She reports a scratchy throat that has started to become painful due to frequent clearing, but she has not experienced a cough or congestion. Both of her children have colds and are congested, but she has not developed these symptoms yet.  She has a history of using Q-tips to clean her ears. She attempted to use a wax removal product in the right ear, thinking the problem was ear wax, but it did not help. She also tried an old remedy involving ear candles,  which did not reveal any wax.  She cannot take Flonase  due to cataracts and has been prescribed an alternative allergy medication, which she stopped taking because it irritated her throat.     Patient Active Problem List   Diagnosis Date Noted   B12 deficiency 09/22/2023   History of anemia 09/22/2023   Anemia due to vitamin B12 deficiency 08/31/2021   Vitamin D  deficiency 08/31/2021   Endometriosis determined by laparoscopy 08/31/2021   Seasonal allergic rhinitis due to pollen 08/31/2021   GAD (generalized anxiety disorder) 02/16/2015   Past Medical History:  Diagnosis Date   Anxiety    Endometriosis    Family history of adverse reaction to anesthesia    mom has nausea   GERD (gastroesophageal reflux disease)    Heart murmur    when younger told had a heart mummur no problems   Migraines    Pneumonia due to COVID-19 virus 04/03/2020   Past Surgical History:  Procedure Laterality Date   LAPAROSCOPY     for endometriosis   ovarian cyst Right    Procedure on Retina Bilateral    hereditary, had to use scleral bucket right and left lasix   ROBOTIC ASSISTED LAPAROSCOPIC OVARIAN CYSTECTOMY N/A 11/25/2019   Procedure: XI ROBOTIC ASSISTED LAPAROSCOPIC UNILATERAL OVARIAN CYSTECTOMY/UNILATERALOOPORECTOMY;  Surgeon: Eloy Herring, MD;  Location: [facility];  Service: Gynecology;  Laterality: N/A;   WISDOM TOOTH EXTRACTION     Allergies  Allergen Reactions   Flonase  [Fluticasone ]     Can not  have as pt with cataracts per opthamology   Penicillins Itching    Rash with minor swelling (CHILDHOOD REACTION)         02/03/2024   10:59 AM 09/22/2023   12:56 PM 07/11/2021    2:06 PM  Depression screen PHQ 2/9  Decreased Interest 0 0 0  Down, Depressed, Hopeless 0 0 0  PHQ - 2 Score 0 0 0  Altered sleeping 0 0   Tired, decreased energy 0 1   Change in appetite 0 0   Feeling bad or failure about yourself  0 1   Trouble concentrating 0 0   Moving slowly or fidgety/restless 0 0    Suicidal thoughts 0 0   PHQ-9 Score 0 2   Difficult doing work/chores Not difficult at all Not difficult at all        02/03/2024   10:59 AM 09/22/2023   12:57 PM 11/15/2017   10:38 AM  GAD 7 : Generalized Anxiety Score  Nervous, Anxious, on Edge 0 1 3  Control/stop worrying 0 1 3  Worry too much - different things 0 0 3  Trouble relaxing 0 0 3  Restless 0 0 2  Easily annoyed or irritable 0 0 3  Afraid - awful might happen 0 0 3  Total GAD 7 Score 0 2 20  Anxiety Difficulty Not difficult at all Not difficult at all       Review of Systems  Constitutional:  Negative for chills and fever.  HENT:  Positive for ear pain and sore throat. Negative for congestion.   Respiratory:  Negative for cough and shortness of breath.   Cardiovascular:  Negative for chest pain.  Gastrointestinal:  Negative for abdominal pain, constipation, diarrhea, heartburn, nausea and vomiting.  Genitourinary:  Negative for dysuria, frequency and urgency.  Neurological:  Negative for dizziness and headaches.  Endo/Heme/Allergies:  Negative for polydipsia.  Psychiatric/Behavioral:  Negative for depression and suicidal ideas. The patient is not nervous/anxious.       Objective:     BP 122/80   Pulse 70   Temp 98.3 F (36.8 C) (Oral)   Ht 5' 6.25 (1.683 m)   Wt 192 lb (87.1 kg)   SpO2 99%   BMI 30.76 kg/m  BP Readings from Last 3 Encounters:  02/03/24 122/80  09/22/23 (!) 106/58  02/20/23 113/72   Wt Readings from Last 3 Encounters:  02/03/24 192 lb (87.1 kg)  09/22/23 199 lb (90.3 kg)  02/18/23 213 lb (96.6 kg)      Physical Exam Vitals and nursing note reviewed.  Constitutional:      Appearance: Normal appearance.  HENT:     Right Ear: There is impacted cerumen.     Left Ear: There is impacted cerumen.     Nose: No congestion.     Mouth/Throat:     Pharynx: Oropharynx is clear.  Eyes:     Conjunctiva/sclera: Conjunctivae normal.  Cardiovascular:     Rate and Rhythm: Normal rate  and regular rhythm.     Pulses: Normal pulses.     Heart sounds: Normal heart sounds.  Pulmonary:     Effort: Pulmonary effort is normal.     Breath sounds: Normal breath sounds.  Neurological:     Mental Status: She is alert and oriented to person, place, and time.  Psychiatric:        Mood and Affect: Mood normal.        Behavior: Behavior normal.  Thought Content: Thought content normal.        Judgment: Judgment normal.      No results found for any visits on 02/03/24.     The ASCVD Risk score (Arnett DK, et al., 2019) failed to calculate for the following reasons:   The 2019 ASCVD risk score is only valid for ages 2 to 35    Assessment & Plan:  Sore throat -     POC COVID-19 BinaxNow -     POCT Influenza A/B  Bilateral impacted cerumen    Assessment and Plan Assessment & Plan Impacted cerumen, bilateral Bilateral impacted cerumen causing hearing loss, fullness, and occasional pain.  - Bilateral cerumen impaction identified on exam. Patient consented to irrigation of canals bilaterally. Bilateral canals irrigated. Patient tolerated well. TM's and canals post irrigation still has cerumen present.  Patient advised to use debrox drop and return at the end of the week to attempt again.   Allergic rhinitis/sore throat Scratchy throat and irritation. Flonase  contraindicated due to cataracts. Previous medications caused throat irritation. - POC covid and influenza tests are negative. - Recommend Claritin or Zyrtec.  - Encourage humidifier use and adequate hydration.   Return in about 3 days (around 02/06/2024) for cerumen impaction.    Carrol Aurora, NP

## 2024-02-03 NOTE — Patient Instructions (Addendum)
 Debrox drops for ear wax and come back at the end of the week to try irrigation again.  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   Drink plenty of water  and fluids, use a humidifier.   It was a pleasure to see you today!

## 2024-02-03 NOTE — Telephone Encounter (Signed)
 NOTED Theresa Christensen thank you for seeing her
